# Patient Record
Sex: Female | Born: 1935 | Race: White | Hispanic: No | Marital: Married | Smoking: Never smoker
Health system: Southern US, Community
[De-identification: ages and names within clinical notes are randomized; demographics above are authoritative.]

## PROBLEM LIST (undated history)

## (undated) DIAGNOSIS — I10 Essential (primary) hypertension: Secondary | ICD-10-CM

## (undated) DIAGNOSIS — E119 Type 2 diabetes mellitus without complications: Secondary | ICD-10-CM

## (undated) DIAGNOSIS — F0390 Unspecified dementia without behavioral disturbance: Secondary | ICD-10-CM

## (undated) HISTORY — PX: ABDOMINAL HYSTERECTOMY: SHX81

## (undated) HISTORY — PX: BREAST EXCISIONAL BIOPSY: SUR124

## (undated) HISTORY — PX: EYE SURGERY: SHX253

---

## 2006-11-08 ENCOUNTER — Encounter: Admission: RE | Admit: 2006-11-08 | Discharge: 2006-11-08 | Payer: Self-pay | Admitting: Internal Medicine

## 2007-05-13 ENCOUNTER — Encounter: Admission: RE | Admit: 2007-05-13 | Discharge: 2007-05-13 | Payer: Self-pay | Admitting: Internal Medicine

## 2007-11-25 ENCOUNTER — Encounter: Admission: RE | Admit: 2007-11-25 | Discharge: 2007-11-25 | Payer: Self-pay | Admitting: Internal Medicine

## 2008-11-30 ENCOUNTER — Encounter: Admission: RE | Admit: 2008-11-30 | Discharge: 2008-11-30 | Payer: Self-pay | Admitting: Internal Medicine

## 2009-12-06 ENCOUNTER — Encounter: Admission: RE | Admit: 2009-12-06 | Discharge: 2009-12-06 | Payer: Self-pay | Admitting: Internal Medicine

## 2010-11-29 ENCOUNTER — Other Ambulatory Visit: Payer: Self-pay | Admitting: Internal Medicine

## 2010-11-29 DIAGNOSIS — Z1239 Encounter for other screening for malignant neoplasm of breast: Secondary | ICD-10-CM

## 2010-12-12 ENCOUNTER — Ambulatory Visit
Admission: RE | Admit: 2010-12-12 | Discharge: 2010-12-12 | Disposition: A | Discharge status: Home / Self Care | Payer: Medicare Other | Source: Ambulatory Visit | Attending: Internal Medicine | Admitting: Internal Medicine

## 2010-12-12 DIAGNOSIS — Z1239 Encounter for other screening for malignant neoplasm of breast: Secondary | ICD-10-CM

## 2011-12-19 ENCOUNTER — Other Ambulatory Visit: Payer: Self-pay | Admitting: Internal Medicine

## 2011-12-19 DIAGNOSIS — Z1231 Encounter for screening mammogram for malignant neoplasm of breast: Secondary | ICD-10-CM

## 2012-01-08 ENCOUNTER — Ambulatory Visit: Payer: Self-pay

## 2012-06-03 ENCOUNTER — Ambulatory Visit
Admission: RE | Admit: 2012-06-03 | Discharge: 2012-06-03 | Disposition: A | Discharge status: Home / Self Care | Payer: Medicare Other | Source: Ambulatory Visit | Attending: Internal Medicine | Admitting: Internal Medicine

## 2012-06-03 DIAGNOSIS — Z1231 Encounter for screening mammogram for malignant neoplasm of breast: Secondary | ICD-10-CM

## 2015-11-01 ENCOUNTER — Other Ambulatory Visit: Payer: Self-pay

## 2015-11-01 ENCOUNTER — Ambulatory Visit
Admission: RE | Admit: 2015-11-01 | Discharge: 2015-11-01 | Disposition: A | Discharge status: Home / Self Care | Payer: Medicare Other | Source: Ambulatory Visit

## 2015-11-01 DIAGNOSIS — Z1231 Encounter for screening mammogram for malignant neoplasm of breast: Secondary | ICD-10-CM | POA: Diagnosis not present

## 2015-11-08 DIAGNOSIS — Z6832 Body mass index (BMI) 32.0-32.9, adult: Secondary | ICD-10-CM | POA: Diagnosis not present

## 2015-11-08 DIAGNOSIS — Z713 Dietary counseling and surveillance: Secondary | ICD-10-CM | POA: Diagnosis not present

## 2015-11-08 DIAGNOSIS — E1165 Type 2 diabetes mellitus with hyperglycemia: Secondary | ICD-10-CM | POA: Diagnosis not present

## 2015-11-08 DIAGNOSIS — I10 Essential (primary) hypertension: Secondary | ICD-10-CM | POA: Diagnosis not present

## 2016-02-07 DIAGNOSIS — I10 Essential (primary) hypertension: Secondary | ICD-10-CM | POA: Diagnosis not present

## 2016-02-07 DIAGNOSIS — E1165 Type 2 diabetes mellitus with hyperglycemia: Secondary | ICD-10-CM | POA: Diagnosis not present

## 2016-03-27 DIAGNOSIS — E119 Type 2 diabetes mellitus without complications: Secondary | ICD-10-CM | POA: Diagnosis not present

## 2016-03-27 DIAGNOSIS — E7800 Pure hypercholesterolemia, unspecified: Secondary | ICD-10-CM | POA: Diagnosis not present

## 2016-03-27 DIAGNOSIS — I10 Essential (primary) hypertension: Secondary | ICD-10-CM | POA: Diagnosis not present

## 2016-05-11 DIAGNOSIS — I10 Essential (primary) hypertension: Secondary | ICD-10-CM | POA: Diagnosis not present

## 2016-05-11 DIAGNOSIS — E7800 Pure hypercholesterolemia, unspecified: Secondary | ICD-10-CM | POA: Diagnosis not present

## 2016-05-11 DIAGNOSIS — E119 Type 2 diabetes mellitus without complications: Secondary | ICD-10-CM | POA: Diagnosis not present

## 2016-05-16 DIAGNOSIS — M25561 Pain in right knee: Secondary | ICD-10-CM | POA: Diagnosis not present

## 2016-05-16 DIAGNOSIS — Z299 Encounter for prophylactic measures, unspecified: Secondary | ICD-10-CM | POA: Diagnosis not present

## 2016-06-12 DIAGNOSIS — E119 Type 2 diabetes mellitus without complications: Secondary | ICD-10-CM | POA: Diagnosis not present

## 2016-06-12 DIAGNOSIS — I10 Essential (primary) hypertension: Secondary | ICD-10-CM | POA: Diagnosis not present

## 2016-06-12 DIAGNOSIS — E7800 Pure hypercholesterolemia, unspecified: Secondary | ICD-10-CM | POA: Diagnosis not present

## 2016-07-26 DIAGNOSIS — E7800 Pure hypercholesterolemia, unspecified: Secondary | ICD-10-CM | POA: Diagnosis not present

## 2016-07-26 DIAGNOSIS — I10 Essential (primary) hypertension: Secondary | ICD-10-CM | POA: Diagnosis not present

## 2016-07-26 DIAGNOSIS — E119 Type 2 diabetes mellitus without complications: Secondary | ICD-10-CM | POA: Diagnosis not present

## 2016-07-31 DIAGNOSIS — Z23 Encounter for immunization: Secondary | ICD-10-CM | POA: Diagnosis not present

## 2016-08-07 DIAGNOSIS — Z0000 Encounter for general adult medical examination without abnormal findings: Secondary | ICD-10-CM | POA: Diagnosis not present

## 2016-08-07 DIAGNOSIS — Z1211 Encounter for screening for malignant neoplasm of colon: Secondary | ICD-10-CM | POA: Diagnosis not present

## 2016-08-07 DIAGNOSIS — Z6837 Body mass index (BMI) 37.0-37.9, adult: Secondary | ICD-10-CM | POA: Diagnosis not present

## 2016-08-07 DIAGNOSIS — E1165 Type 2 diabetes mellitus with hyperglycemia: Secondary | ICD-10-CM | POA: Diagnosis not present

## 2016-08-07 DIAGNOSIS — Z299 Encounter for prophylactic measures, unspecified: Secondary | ICD-10-CM | POA: Diagnosis not present

## 2016-08-07 DIAGNOSIS — E7800 Pure hypercholesterolemia, unspecified: Secondary | ICD-10-CM | POA: Diagnosis not present

## 2016-08-07 DIAGNOSIS — Z90710 Acquired absence of both cervix and uterus: Secondary | ICD-10-CM | POA: Diagnosis not present

## 2016-08-07 DIAGNOSIS — Z79899 Other long term (current) drug therapy: Secondary | ICD-10-CM | POA: Diagnosis not present

## 2016-08-07 DIAGNOSIS — Z7189 Other specified counseling: Secondary | ICD-10-CM | POA: Diagnosis not present

## 2016-08-07 DIAGNOSIS — Z1389 Encounter for screening for other disorder: Secondary | ICD-10-CM | POA: Diagnosis not present

## 2016-08-07 DIAGNOSIS — R5383 Other fatigue: Secondary | ICD-10-CM | POA: Diagnosis not present

## 2016-10-02 DIAGNOSIS — E8940 Asymptomatic postprocedural ovarian failure: Secondary | ICD-10-CM | POA: Diagnosis not present

## 2016-11-13 DIAGNOSIS — Z6837 Body mass index (BMI) 37.0-37.9, adult: Secondary | ICD-10-CM | POA: Diagnosis not present

## 2016-11-13 DIAGNOSIS — Z299 Encounter for prophylactic measures, unspecified: Secondary | ICD-10-CM | POA: Diagnosis not present

## 2016-11-13 DIAGNOSIS — Z713 Dietary counseling and surveillance: Secondary | ICD-10-CM | POA: Diagnosis not present

## 2016-11-13 DIAGNOSIS — E1165 Type 2 diabetes mellitus with hyperglycemia: Secondary | ICD-10-CM | POA: Diagnosis not present

## 2016-12-04 ENCOUNTER — Ambulatory Visit
Admission: RE | Admit: 2016-12-04 | Discharge: 2016-12-04 | Disposition: A | Discharge status: Home / Self Care | Payer: Medicare Other | Source: Ambulatory Visit | Attending: Internal Medicine | Admitting: Internal Medicine

## 2016-12-04 ENCOUNTER — Other Ambulatory Visit: Payer: Self-pay | Admitting: Internal Medicine

## 2016-12-04 DIAGNOSIS — Z1231 Encounter for screening mammogram for malignant neoplasm of breast: Secondary | ICD-10-CM

## 2017-02-19 DIAGNOSIS — I10 Essential (primary) hypertension: Secondary | ICD-10-CM | POA: Diagnosis not present

## 2017-02-19 DIAGNOSIS — E1165 Type 2 diabetes mellitus with hyperglycemia: Secondary | ICD-10-CM | POA: Diagnosis not present

## 2017-02-19 DIAGNOSIS — E7800 Pure hypercholesterolemia, unspecified: Secondary | ICD-10-CM | POA: Diagnosis not present

## 2017-02-19 DIAGNOSIS — Z299 Encounter for prophylactic measures, unspecified: Secondary | ICD-10-CM | POA: Diagnosis not present

## 2017-02-19 DIAGNOSIS — R42 Dizziness and giddiness: Secondary | ICD-10-CM | POA: Diagnosis not present

## 2017-02-19 DIAGNOSIS — Z6835 Body mass index (BMI) 35.0-35.9, adult: Secondary | ICD-10-CM | POA: Diagnosis not present

## 2017-06-11 DIAGNOSIS — E7800 Pure hypercholesterolemia, unspecified: Secondary | ICD-10-CM | POA: Diagnosis not present

## 2017-06-11 DIAGNOSIS — R413 Other amnesia: Secondary | ICD-10-CM | POA: Diagnosis not present

## 2017-06-11 DIAGNOSIS — E1165 Type 2 diabetes mellitus with hyperglycemia: Secondary | ICD-10-CM | POA: Diagnosis not present

## 2017-06-11 DIAGNOSIS — Z299 Encounter for prophylactic measures, unspecified: Secondary | ICD-10-CM | POA: Diagnosis not present

## 2017-06-11 DIAGNOSIS — I10 Essential (primary) hypertension: Secondary | ICD-10-CM | POA: Diagnosis not present

## 2017-07-02 DIAGNOSIS — I10 Essential (primary) hypertension: Secondary | ICD-10-CM | POA: Diagnosis not present

## 2017-07-02 DIAGNOSIS — Z299 Encounter for prophylactic measures, unspecified: Secondary | ICD-10-CM | POA: Diagnosis not present

## 2017-07-02 DIAGNOSIS — M810 Age-related osteoporosis without current pathological fracture: Secondary | ICD-10-CM | POA: Diagnosis not present

## 2017-07-02 DIAGNOSIS — Z6835 Body mass index (BMI) 35.0-35.9, adult: Secondary | ICD-10-CM | POA: Diagnosis not present

## 2017-07-02 DIAGNOSIS — E1165 Type 2 diabetes mellitus with hyperglycemia: Secondary | ICD-10-CM | POA: Diagnosis not present

## 2017-07-02 DIAGNOSIS — E7800 Pure hypercholesterolemia, unspecified: Secondary | ICD-10-CM | POA: Diagnosis not present

## 2017-07-02 DIAGNOSIS — F0390 Unspecified dementia without behavioral disturbance: Secondary | ICD-10-CM | POA: Diagnosis not present

## 2017-07-10 DIAGNOSIS — Z8673 Personal history of transient ischemic attack (TIA), and cerebral infarction without residual deficits: Secondary | ICD-10-CM | POA: Diagnosis not present

## 2017-07-10 DIAGNOSIS — F068 Other specified mental disorders due to known physiological condition: Secondary | ICD-10-CM | POA: Diagnosis not present

## 2017-07-10 DIAGNOSIS — R413 Other amnesia: Secondary | ICD-10-CM | POA: Diagnosis not present

## 2017-07-10 DIAGNOSIS — I6782 Cerebral ischemia: Secondary | ICD-10-CM | POA: Diagnosis not present

## 2017-07-10 DIAGNOSIS — G319 Degenerative disease of nervous system, unspecified: Secondary | ICD-10-CM | POA: Diagnosis not present

## 2017-07-23 DIAGNOSIS — Z6835 Body mass index (BMI) 35.0-35.9, adult: Secondary | ICD-10-CM | POA: Diagnosis not present

## 2017-07-23 DIAGNOSIS — Z299 Encounter for prophylactic measures, unspecified: Secondary | ICD-10-CM | POA: Diagnosis not present

## 2017-07-23 DIAGNOSIS — F0390 Unspecified dementia without behavioral disturbance: Secondary | ICD-10-CM | POA: Diagnosis not present

## 2017-07-23 DIAGNOSIS — E7800 Pure hypercholesterolemia, unspecified: Secondary | ICD-10-CM | POA: Diagnosis not present

## 2017-07-23 DIAGNOSIS — E1165 Type 2 diabetes mellitus with hyperglycemia: Secondary | ICD-10-CM | POA: Diagnosis not present

## 2017-07-23 DIAGNOSIS — I10 Essential (primary) hypertension: Secondary | ICD-10-CM | POA: Diagnosis not present

## 2017-08-09 DIAGNOSIS — Z23 Encounter for immunization: Secondary | ICD-10-CM | POA: Diagnosis not present

## 2017-08-26 DIAGNOSIS — Z79899 Other long term (current) drug therapy: Secondary | ICD-10-CM | POA: Diagnosis not present

## 2017-08-26 DIAGNOSIS — I10 Essential (primary) hypertension: Secondary | ICD-10-CM | POA: Diagnosis not present

## 2017-08-26 DIAGNOSIS — E7800 Pure hypercholesterolemia, unspecified: Secondary | ICD-10-CM | POA: Diagnosis not present

## 2017-08-26 DIAGNOSIS — Z7189 Other specified counseling: Secondary | ICD-10-CM | POA: Diagnosis not present

## 2017-08-26 DIAGNOSIS — E669 Obesity, unspecified: Secondary | ICD-10-CM | POA: Diagnosis not present

## 2017-08-26 DIAGNOSIS — E1165 Type 2 diabetes mellitus with hyperglycemia: Secondary | ICD-10-CM | POA: Diagnosis not present

## 2017-08-26 DIAGNOSIS — Z0000 Encounter for general adult medical examination without abnormal findings: Secondary | ICD-10-CM | POA: Diagnosis not present

## 2017-08-26 DIAGNOSIS — R5383 Other fatigue: Secondary | ICD-10-CM | POA: Diagnosis not present

## 2017-08-26 DIAGNOSIS — Z1339 Encounter for screening examination for other mental health and behavioral disorders: Secondary | ICD-10-CM | POA: Diagnosis not present

## 2017-08-26 DIAGNOSIS — Z1211 Encounter for screening for malignant neoplasm of colon: Secondary | ICD-10-CM | POA: Diagnosis not present

## 2017-08-26 DIAGNOSIS — Z299 Encounter for prophylactic measures, unspecified: Secondary | ICD-10-CM | POA: Diagnosis not present

## 2017-08-26 DIAGNOSIS — Z1331 Encounter for screening for depression: Secondary | ICD-10-CM | POA: Diagnosis not present

## 2017-09-17 DIAGNOSIS — Z789 Other specified health status: Secondary | ICD-10-CM | POA: Diagnosis not present

## 2017-09-17 DIAGNOSIS — Z299 Encounter for prophylactic measures, unspecified: Secondary | ICD-10-CM | POA: Diagnosis not present

## 2017-09-17 DIAGNOSIS — E1165 Type 2 diabetes mellitus with hyperglycemia: Secondary | ICD-10-CM | POA: Diagnosis not present

## 2017-09-17 DIAGNOSIS — Z713 Dietary counseling and surveillance: Secondary | ICD-10-CM | POA: Diagnosis not present

## 2017-09-17 DIAGNOSIS — Z6834 Body mass index (BMI) 34.0-34.9, adult: Secondary | ICD-10-CM | POA: Diagnosis not present

## 2017-10-18 DIAGNOSIS — N390 Urinary tract infection, site not specified: Secondary | ICD-10-CM | POA: Diagnosis not present

## 2017-10-18 DIAGNOSIS — R404 Transient alteration of awareness: Secondary | ICD-10-CM | POA: Diagnosis not present

## 2017-10-18 DIAGNOSIS — N289 Disorder of kidney and ureter, unspecified: Secondary | ICD-10-CM | POA: Diagnosis not present

## 2017-10-18 DIAGNOSIS — M810 Age-related osteoporosis without current pathological fracture: Secondary | ICD-10-CM | POA: Diagnosis not present

## 2017-10-18 DIAGNOSIS — I10 Essential (primary) hypertension: Secondary | ICD-10-CM | POA: Diagnosis not present

## 2017-10-18 DIAGNOSIS — E876 Hypokalemia: Secondary | ICD-10-CM | POA: Diagnosis not present

## 2017-10-18 DIAGNOSIS — R42 Dizziness and giddiness: Secondary | ICD-10-CM | POA: Diagnosis not present

## 2017-10-18 DIAGNOSIS — R55 Syncope and collapse: Secondary | ICD-10-CM | POA: Diagnosis not present

## 2017-10-18 DIAGNOSIS — Z79899 Other long term (current) drug therapy: Secondary | ICD-10-CM | POA: Diagnosis not present

## 2017-10-18 DIAGNOSIS — Z7984 Long term (current) use of oral hypoglycemic drugs: Secondary | ICD-10-CM | POA: Diagnosis not present

## 2017-10-31 DIAGNOSIS — F0390 Unspecified dementia without behavioral disturbance: Secondary | ICD-10-CM | POA: Diagnosis not present

## 2017-10-31 DIAGNOSIS — I10 Essential (primary) hypertension: Secondary | ICD-10-CM | POA: Diagnosis not present

## 2017-10-31 DIAGNOSIS — N390 Urinary tract infection, site not specified: Secondary | ICD-10-CM | POA: Diagnosis not present

## 2017-10-31 DIAGNOSIS — E7800 Pure hypercholesterolemia, unspecified: Secondary | ICD-10-CM | POA: Diagnosis not present

## 2017-10-31 DIAGNOSIS — Z6833 Body mass index (BMI) 33.0-33.9, adult: Secondary | ICD-10-CM | POA: Diagnosis not present

## 2017-11-08 DIAGNOSIS — E876 Hypokalemia: Secondary | ICD-10-CM | POA: Diagnosis not present

## 2017-12-26 DIAGNOSIS — E7800 Pure hypercholesterolemia, unspecified: Secondary | ICD-10-CM | POA: Diagnosis not present

## 2017-12-26 DIAGNOSIS — I6381 Other cerebral infarction due to occlusion or stenosis of small artery: Secondary | ICD-10-CM | POA: Diagnosis not present

## 2017-12-26 DIAGNOSIS — Z6832 Body mass index (BMI) 32.0-32.9, adult: Secondary | ICD-10-CM | POA: Diagnosis not present

## 2017-12-26 DIAGNOSIS — F0390 Unspecified dementia without behavioral disturbance: Secondary | ICD-10-CM | POA: Diagnosis not present

## 2017-12-26 DIAGNOSIS — Z299 Encounter for prophylactic measures, unspecified: Secondary | ICD-10-CM | POA: Diagnosis not present

## 2017-12-26 DIAGNOSIS — I10 Essential (primary) hypertension: Secondary | ICD-10-CM | POA: Diagnosis not present

## 2017-12-26 DIAGNOSIS — E1165 Type 2 diabetes mellitus with hyperglycemia: Secondary | ICD-10-CM | POA: Diagnosis not present

## 2018-01-09 DIAGNOSIS — E7800 Pure hypercholesterolemia, unspecified: Secondary | ICD-10-CM | POA: Diagnosis not present

## 2018-01-09 DIAGNOSIS — I6381 Other cerebral infarction due to occlusion or stenosis of small artery: Secondary | ICD-10-CM | POA: Diagnosis not present

## 2018-01-09 DIAGNOSIS — J329 Chronic sinusitis, unspecified: Secondary | ICD-10-CM | POA: Diagnosis not present

## 2018-01-09 DIAGNOSIS — Z299 Encounter for prophylactic measures, unspecified: Secondary | ICD-10-CM | POA: Diagnosis not present

## 2018-01-09 DIAGNOSIS — E1165 Type 2 diabetes mellitus with hyperglycemia: Secondary | ICD-10-CM | POA: Diagnosis not present

## 2018-01-09 DIAGNOSIS — Z6832 Body mass index (BMI) 32.0-32.9, adult: Secondary | ICD-10-CM | POA: Diagnosis not present

## 2018-01-09 DIAGNOSIS — Z789 Other specified health status: Secondary | ICD-10-CM | POA: Diagnosis not present

## 2018-01-28 DIAGNOSIS — Z6831 Body mass index (BMI) 31.0-31.9, adult: Secondary | ICD-10-CM | POA: Diagnosis not present

## 2018-01-28 DIAGNOSIS — R05 Cough: Secondary | ICD-10-CM | POA: Diagnosis not present

## 2018-01-28 DIAGNOSIS — Z299 Encounter for prophylactic measures, unspecified: Secondary | ICD-10-CM | POA: Diagnosis not present

## 2018-01-28 DIAGNOSIS — J189 Pneumonia, unspecified organism: Secondary | ICD-10-CM | POA: Diagnosis not present

## 2018-01-28 DIAGNOSIS — I10 Essential (primary) hypertension: Secondary | ICD-10-CM | POA: Diagnosis not present

## 2018-01-28 DIAGNOSIS — Z713 Dietary counseling and surveillance: Secondary | ICD-10-CM | POA: Diagnosis not present

## 2018-01-28 DIAGNOSIS — J22 Unspecified acute lower respiratory infection: Secondary | ICD-10-CM | POA: Diagnosis not present

## 2018-02-06 ENCOUNTER — Other Ambulatory Visit: Payer: Self-pay

## 2018-02-06 ENCOUNTER — Emergency Department (HOSPITAL_COMMUNITY): Payer: Medicare Other

## 2018-02-06 ENCOUNTER — Encounter (HOSPITAL_COMMUNITY): Payer: Self-pay | Admitting: Emergency Medicine

## 2018-02-06 ENCOUNTER — Inpatient Hospital Stay (HOSPITAL_COMMUNITY)
Admission: EM | Admit: 2018-02-06 | Discharge: 2018-02-08 | DRG: 872 | Disposition: A | Discharge status: Home / Self Care | Payer: Medicare Other | Attending: Internal Medicine | Admitting: Internal Medicine

## 2018-02-06 DIAGNOSIS — R531 Weakness: Secondary | ICD-10-CM

## 2018-02-06 DIAGNOSIS — Z7984 Long term (current) use of oral hypoglycemic drugs: Secondary | ICD-10-CM | POA: Diagnosis not present

## 2018-02-06 DIAGNOSIS — Z9181 History of falling: Secondary | ICD-10-CM

## 2018-02-06 DIAGNOSIS — I10 Essential (primary) hypertension: Secondary | ICD-10-CM | POA: Diagnosis present

## 2018-02-06 DIAGNOSIS — F0390 Unspecified dementia without behavioral disturbance: Secondary | ICD-10-CM | POA: Diagnosis present

## 2018-02-06 DIAGNOSIS — Z79899 Other long term (current) drug therapy: Secondary | ICD-10-CM

## 2018-02-06 DIAGNOSIS — Z299 Encounter for prophylactic measures, unspecified: Secondary | ICD-10-CM | POA: Diagnosis not present

## 2018-02-06 DIAGNOSIS — I6381 Other cerebral infarction due to occlusion or stenosis of small artery: Secondary | ICD-10-CM | POA: Diagnosis not present

## 2018-02-06 DIAGNOSIS — R55 Syncope and collapse: Secondary | ICD-10-CM | POA: Diagnosis not present

## 2018-02-06 DIAGNOSIS — I6529 Occlusion and stenosis of unspecified carotid artery: Secondary | ICD-10-CM | POA: Diagnosis not present

## 2018-02-06 DIAGNOSIS — E11649 Type 2 diabetes mellitus with hypoglycemia without coma: Secondary | ICD-10-CM | POA: Diagnosis present

## 2018-02-06 DIAGNOSIS — E876 Hypokalemia: Secondary | ICD-10-CM | POA: Diagnosis present

## 2018-02-06 DIAGNOSIS — Z6829 Body mass index (BMI) 29.0-29.9, adult: Secondary | ICD-10-CM | POA: Diagnosis not present

## 2018-02-06 DIAGNOSIS — E162 Hypoglycemia, unspecified: Secondary | ICD-10-CM

## 2018-02-06 DIAGNOSIS — E872 Acidosis, unspecified: Secondary | ICD-10-CM | POA: Diagnosis present

## 2018-02-06 DIAGNOSIS — E860 Dehydration: Secondary | ICD-10-CM | POA: Diagnosis not present

## 2018-02-06 DIAGNOSIS — R410 Disorientation, unspecified: Secondary | ICD-10-CM | POA: Diagnosis not present

## 2018-02-06 DIAGNOSIS — A419 Sepsis, unspecified organism: Principal | ICD-10-CM | POA: Diagnosis present

## 2018-02-06 DIAGNOSIS — E119 Type 2 diabetes mellitus without complications: Secondary | ICD-10-CM

## 2018-02-06 DIAGNOSIS — N390 Urinary tract infection, site not specified: Secondary | ICD-10-CM | POA: Diagnosis not present

## 2018-02-06 HISTORY — DX: Type 2 diabetes mellitus without complications: E11.9

## 2018-02-06 HISTORY — DX: Unspecified dementia, unspecified severity, without behavioral disturbance, psychotic disturbance, mood disturbance, and anxiety: F03.90

## 2018-02-06 HISTORY — DX: Essential (primary) hypertension: I10

## 2018-02-06 LAB — CBC WITH DIFFERENTIAL/PLATELET
BASOS PCT: 0 %
Basophils Absolute: 0 10*3/uL (ref 0.0–0.1)
Eosinophils Absolute: 0.1 10*3/uL (ref 0.0–0.7)
Eosinophils Relative: 1 %
HEMATOCRIT: 41.6 % (ref 36.0–46.0)
HEMOGLOBIN: 13.2 g/dL (ref 12.0–15.0)
LYMPHS ABS: 3.2 10*3/uL (ref 0.7–4.0)
LYMPHS PCT: 23 %
MCH: 30.8 pg (ref 26.0–34.0)
MCHC: 31.7 g/dL (ref 30.0–36.0)
MCV: 97 fL (ref 78.0–100.0)
MONOS PCT: 8 %
Monocytes Absolute: 1.2 10*3/uL — ABNORMAL HIGH (ref 0.1–1.0)
NEUTROS ABS: 9.4 10*3/uL — AB (ref 1.7–7.7)
Neutrophils Relative %: 68 %
Platelets: 269 10*3/uL (ref 150–400)
RBC: 4.29 MIL/uL (ref 3.87–5.11)
RDW: 14.3 % (ref 11.5–15.5)
WBC: 13.9 10*3/uL — ABNORMAL HIGH (ref 4.0–10.5)

## 2018-02-06 LAB — URINALYSIS, ROUTINE W REFLEX MICROSCOPIC
BILIRUBIN URINE: NEGATIVE
Glucose, UA: 50 mg/dL — AB
KETONES UR: 5 mg/dL — AB
Nitrite: NEGATIVE
PROTEIN: NEGATIVE mg/dL
Specific Gravity, Urine: 1.017 (ref 1.005–1.030)
pH: 5 (ref 5.0–8.0)

## 2018-02-06 LAB — HEPATIC FUNCTION PANEL
ALK PHOS: 60 U/L (ref 38–126)
ALT: 17 U/L (ref 14–54)
AST: 32 U/L (ref 15–41)
Albumin: 3.3 g/dL — ABNORMAL LOW (ref 3.5–5.0)
BILIRUBIN INDIRECT: 0.7 mg/dL (ref 0.3–0.9)
Bilirubin, Direct: 0.2 mg/dL (ref 0.1–0.5)
TOTAL PROTEIN: 6.5 g/dL (ref 6.5–8.1)
Total Bilirubin: 0.9 mg/dL (ref 0.3–1.2)

## 2018-02-06 LAB — BASIC METABOLIC PANEL
Anion gap: <20 — ABNORMAL HIGH (ref 5–15)
BUN: 31 mg/dL — ABNORMAL HIGH (ref 6–20)
CHLORIDE: 91 mmol/L — AB (ref 101–111)
CO2: 25 mmol/L (ref 22–32)
CREATININE: 1.49 mg/dL — AB (ref 0.44–1.00)
Calcium: 9.4 mg/dL (ref 8.9–10.3)
GFR calc non Af Amer: 32 mL/min — ABNORMAL LOW (ref >60)
GFR, EST AFRICAN AMERICAN: 37 mL/min — AB (ref >60)
Glucose, Bld: 55 mg/dL — ABNORMAL LOW (ref 65–99)
Potassium: 3.1 mmol/L — ABNORMAL LOW (ref 3.5–5.1)
Sodium: 137 mmol/L (ref 135–145)

## 2018-02-06 LAB — I-STAT CG4 LACTIC ACID, ED
LACTIC ACID, VENOUS: 7.83 mmol/L — AB (ref 0.5–1.9)
Lactic Acid, Venous: <0.3 mmol/L — ABNORMAL LOW (ref 0.5–1.9)

## 2018-02-06 LAB — TROPONIN I: Troponin I: <0.03 ng/mL (ref <0.03)

## 2018-02-06 LAB — MAGNESIUM: Magnesium: 1.9 mg/dL (ref 1.7–2.4)

## 2018-02-06 LAB — LIPASE, BLOOD: Lipase: 75 U/L — ABNORMAL HIGH (ref 11–51)

## 2018-02-06 MED ORDER — MEMANTINE HCL ER 7 MG PO CP24
21.0000 mg | ORAL_CAPSULE | Freq: Every evening | ORAL | Status: DC
  Administered 2018-02-06 – 2018-02-07 (×2): 21 mg via ORAL
  Filled 2018-02-06 (×4): qty 3

## 2018-02-06 MED ORDER — VANCOMYCIN HCL IN DEXTROSE 1-5 GM/200ML-% IV SOLN
1000.0000 mg | Freq: Once | INTRAVENOUS | Status: DC
  Filled 2018-02-06: qty 200

## 2018-02-06 MED ORDER — SODIUM CHLORIDE 0.9 % IV SOLN
2.0000 g | INTRAVENOUS | Status: DC
  Administered 2018-02-06 – 2018-02-07 (×2): 2 g via INTRAVENOUS
  Filled 2018-02-06: qty 20
  Filled 2018-02-06: qty 2
  Filled 2018-02-06 (×3): qty 20

## 2018-02-06 MED ORDER — VANCOMYCIN HCL 10 G IV SOLR
1500.0000 mg | Freq: Once | INTRAVENOUS | Status: AC
  Administered 2018-02-06: 1500 mg via INTRAVENOUS
  Filled 2018-02-06: qty 1500

## 2018-02-06 MED ORDER — DEXTROSE-NACL 5-0.45 % IV SOLN
INTRAVENOUS | Status: DC
Start: 2018-02-06 — End: 2018-02-07
  Administered 2018-02-06: 18:00:00 via INTRAVENOUS

## 2018-02-06 MED ORDER — LORATADINE 10 MG PO TABS
10.0000 mg | ORAL_TABLET | Freq: Every day | ORAL | Status: DC
  Administered 2018-02-06 – 2018-02-08 (×3): 10 mg via ORAL
  Filled 2018-02-06 (×3): qty 1

## 2018-02-06 MED ORDER — SODIUM CHLORIDE 0.9 % IV BOLUS
1000.0000 mL | Freq: Once | INTRAVENOUS | Status: AC
  Administered 2018-02-06: 1000 mL via INTRAVENOUS

## 2018-02-06 MED ORDER — ONDANSETRON HCL 4 MG PO TABS: 4.0000 mg | ORAL_TABLET | Freq: Four times a day (QID) | ORAL | Status: DC | PRN

## 2018-02-06 MED ORDER — ACETAMINOPHEN 325 MG PO TABS: 650.0000 mg | ORAL_TABLET | Freq: Four times a day (QID) | ORAL | Status: DC | PRN

## 2018-02-06 MED ORDER — DEXTROSE 50 % IV SOLN
INTRAVENOUS | Status: AC
  Administered 2018-02-06: 20:00:00
  Filled 2018-02-06: qty 50

## 2018-02-06 MED ORDER — SODIUM CHLORIDE 0.9 % IV BOLUS
500.0000 mL | Freq: Once | INTRAVENOUS | Status: AC
Start: 2018-02-06 — End: 2018-02-06
  Administered 2018-02-06: 500 mL via INTRAVENOUS

## 2018-02-06 MED ORDER — DONEPEZIL HCL 5 MG PO TABS
10.0000 mg | ORAL_TABLET | Freq: Every evening | ORAL | Status: DC
  Administered 2018-02-06 – 2018-02-07 (×2): 10 mg via ORAL
  Filled 2018-02-06 (×2): qty 2
  Filled 2018-02-06: qty 1

## 2018-02-06 MED ORDER — ACETAMINOPHEN 650 MG RE SUPP: 650.0000 mg | Freq: Four times a day (QID) | RECTAL | Status: DC | PRN

## 2018-02-06 MED ORDER — POTASSIUM CHLORIDE 10 MEQ/100ML IV SOLN
10.0000 meq | INTRAVENOUS | Status: AC
  Administered 2018-02-06 – 2018-02-07 (×4): 10 meq via INTRAVENOUS
  Filled 2018-02-06 (×5): qty 100

## 2018-02-06 MED ORDER — DEXTROSE 50 % IV SOLN
1.0000 | Freq: Once | INTRAVENOUS | Status: AC
  Administered 2018-02-06: 50 mL via INTRAVENOUS

## 2018-02-06 MED ORDER — PIPERACILLIN-TAZOBACTAM 3.375 G IVPB 30 MIN
3.3750 g | Freq: Once | INTRAVENOUS | Status: AC
  Administered 2018-02-06: 3.375 g via INTRAVENOUS
  Filled 2018-02-06: qty 50

## 2018-02-06 MED ORDER — MAGNESIUM GLUCONATE 500 MG PO TABS
500.0000 mg | ORAL_TABLET | Freq: Every evening | ORAL | Status: DC
  Administered 2018-02-07: 500 mg via ORAL
  Filled 2018-02-06 (×4): qty 1

## 2018-02-06 MED ORDER — ENOXAPARIN SODIUM 40 MG/0.4ML ~~LOC~~ SOLN
40.0000 mg | SUBCUTANEOUS | Status: DC
  Administered 2018-02-06 – 2018-02-07 (×2): 40 mg via SUBCUTANEOUS
  Filled 2018-02-06 (×2): qty 0.4

## 2018-02-06 MED ORDER — ONDANSETRON HCL 4 MG/2ML IJ SOLN: 4.0000 mg | Freq: Four times a day (QID) | INTRAMUSCULAR | Status: DC | PRN

## 2018-02-06 NOTE — ED Notes (Signed)
ekg handed off to Dr. Dayna Barker

## 2018-02-06 NOTE — ED Provider Notes (Signed)
Emergency Department Provider Note   I have reviewed the triage vital signs and the nursing notes.   HISTORY  Chief Complaint Weakness   HPI Victoria Christian is a 83 y.o. female with PMH of DM, HTN, and new-onset Dementia starting in Dec 2018 resents to the emergency department for evaluation of worsening generalized weakness, confusion, lightheadedness with standing, and hypertension at the PCP office today.  The patient's daughter provides most of the history due to the patient's underlying dementia.  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  She began to have sudden onset confusion and an MRI at that time confirmed a stroke.  She has subsequently developed rapid onset and progressively worsening dementia symptoms.  She has been taking her medications for dementia but is no longer able to work and over the past 2 weeks she has become much more weak and confused.  She did have an episode of strep throat in March which was treated.  Family states she is not eating or drinking and they have been following with the PCP who is apparently concerned about her potassium levels and kidney function based on labs drawn in March.  Family has not noticed any fevers.  The patient did have a fall approximately 2 weeks ago with unknown head trauma.  Denies any dysuria, hesitancy, urgency.  Weakness and lightheadedness is mostly with standing.  Denies any chest pain or palpitations.   Level 5 caveat: Dementia.   Past Medical History:  Diagnosis Date  . Dementia   . Diabetes mellitus without complication ([place])   . Hypertension     There are no active problems to display for this patient.   Past Surgical History:  Procedure Laterality Date  . ABDOMINAL HYSTERECTOMY    . BREAST EXCISIONAL BIOPSY Left    1975  . EYE SURGERY        Allergies Patient has no known allergies.  Family History  Problem Relation Age of Onset  . Congestive Heart Failure  Mother   . Dementia Father   . Cancer Other     Social History Social History   Tobacco Use  . Smoking status: Never Smoker  . Smokeless tobacco: Never Used  Substance Use Topics  . Alcohol use: Never    Frequency: Never  . Drug use: Never    Review of Systems  Constitutional: No fever/chills. Positive generalized weakness and lightheadedness with standing.  Eyes: No visual changes. ENT: No sore throat. Cardiovascular: Denies chest pain. Respiratory: Denies shortness of breath. Positive cough.  Gastrointestinal: No abdominal pain. Positive nausea, no vomiting. No diarrhea. No constipation. Genitourinary: Negative for dysuria. Musculoskeletal: Negative for back pain. Skin: Negative for rash. Neurological: Negative for headaches, focal weakness or numbness.  10-point ROS otherwise negative.  ____________________________________________   PHYSICAL EXAM:  VITAL SIGNS: ED Triage Vitals  Enc Vitals Group     BP 02/06/18 1436 (!) 95/51     Pulse Rate 02/06/18 1436 85     Resp 02/06/18 1436 18     Temp 02/06/18 1436 98.2 F (36.8 C)     Temp Source 02/06/18 1436 Oral     SpO2 02/06/18 1436 94 %     Weight 02/06/18 1436 159 lb (72.1 kg)     Height 02/06/18 1436 5' 1.5" (1.562 m)     Pain Score 02/06/18 1437 0   Constitutional: Alert with mild confusion. Well appearing and in no acute distress. Eyes: Conjunctivae are normal.  PERRL. Baseline dysconjugate gaze.  Head: Atraumatic. Nose: No congestion/rhinnorhea. Mouth/Throat: Mucous membranes are moist.  Oropharynx non-erythematous. Neck: No stridor.   Cardiovascular: Normal rate, regular rhythm. Good peripheral circulation. Grossly normal heart sounds.   Respiratory: Normal respiratory effort.  No retractions. Lungs CTAB. Gastrointestinal: Soft and nontender. No distention.  Musculoskeletal: No lower extremity tenderness nor edema. No gross deformities of extremities. Neurologic:  Normal speech and language. No gross  focal neurologic deficits are appreciated. Normal upper/lower extremity weakness. Baseline CN exam 2-12.  Skin:  Skin is warm, dry and intact. No rash noted.  ____________________________________________   LABS (all labs ordered are listed, but only abnormal results are displayed)  Labs Reviewed  CBC WITH DIFFERENTIAL/PLATELET - Abnormal; Notable for the following components:      Result Value   WBC 13.9 (*)    Neutro Abs 9.4 (*)    Monocytes Absolute 1.2 (*)    All other components within normal limits  BASIC METABOLIC PANEL - Abnormal; Notable for the following components:   Potassium 3.1 (*)    Chloride 91 (*)    Glucose, Bld 55 (*)    BUN 31 (*)    Creatinine, Ser 1.49 (*)    GFR calc non Af Amer 32 (*)    GFR calc Af Amer 37 (*)    Anion gap <20 (*)    All other components within normal limits  HEPATIC FUNCTION PANEL - Abnormal; Notable for the following components:   Albumin 3.3 (*)    All other components within normal limits  LIPASE, BLOOD - Abnormal; Notable for the following components:   Lipase 75 (*)    All other components within normal limits  I-STAT CG4 LACTIC ACID, ED - Abnormal; Notable for the following components:   Lactic Acid, Venous 7.83 (*)    All other components within normal limits  CULTURE, BLOOD (ROUTINE X 2)  CULTURE, BLOOD (ROUTINE X 2)  URINE CULTURE  TROPONIN I  URINALYSIS, ROUTINE W REFLEX MICROSCOPIC  CBG MONITORING, ED  I-STAT CG4 LACTIC ACID, ED  CBG MONITORING, ED   ____________________________________________  EKG   EKG Interpretation  Date/Time:  Thursday February 06 2018 14:41:01 EDT Ventricular Rate:  80 PR Interval:  152 QRS Duration: 78 QT Interval:  414 QTC Calculation: 477 R Axis:   -22 Text Interpretation:  Normal Sinus rhythm  No STEMI.  Confirmed by Nanda Quinton [phone]) on 02/06/2018 3:17:43 PM       ____________________________________________  RADIOLOGY  Dg Chest 2 View  Result Date: 02/06/2018 CLINICAL  DATA:  Confusion and weakness for 1 month EXAM: CHEST - 2 VIEW COMPARISON:  01/28/2018 FINDINGS: No acute airspace disease. No pleural effusion. Normal heart size with aortic atherosclerosis. No pneumothorax. Degenerative changes of the spine. IMPRESSION: No active cardiopulmonary disease. Electronically Signed   By: Donavan Foil M.D.   On: 02/06/2018 16:16   Ct Head Wo Contrast  Result Date: 02/06/2018 CLINICAL DATA:  Weakness for a month.  Hypotension.  Confusion. EXAM: CT HEAD WITHOUT CONTRAST TECHNIQUE: Contiguous axial images were obtained from the base of the skull through the vertex without intravenous contrast. COMPARISON:  October 18, 2017 FINDINGS: Brain: No subdural, epidural, or subarachnoid hemorrhage. Cerebellum, brainstem, and basal cisterns are normal. Ventricles and sulci are stable. Haning matter changes are stable. No acute cortical ischemia or infarct. No mass effect or midline shift. Prominent perivascular spaces versus lacunar infarcts in the left inferior basal ganglia. These are favored to represent prominent perivascular spaces. Vascular:  Calcified atherosclerosis in the intracranial carotids. Skull: Normal. Negative for fracture or focal lesion. Sinuses/Orbits: No acute finding. Other: None. IMPRESSION: No acute intracranial abnormalities to explain the patient's symptoms. Electronically Signed   By: Dorise Bullion III M.D   On: 02/06/2018 18:06    ____________________________________________   PROCEDURES  Procedure(s) performed:   .Critical Care Performed by: Margette Fast, MD Authorized by: Margette Fast, MD   Critical care provider statement:    Critical care time (minutes):  35   Critical care time was exclusive of:  Separately billable procedures and treating other patients and teaching time   Critical care was necessary to treat or prevent imminent or life-threatening deterioration of the following conditions:  Shock, dehydration and circulatory failure    Critical care was time spent personally by me on the following activities:  Blood draw for specimens, development of treatment plan with patient or surrogate, evaluation of patient's response to treatment, examination of patient, obtaining history from patient or surrogate, ordering and performing treatments and interventions, ordering and review of laboratory studies, ordering and review of radiographic studies, pulse oximetry, re-evaluation of patient's condition and review of old charts   I assumed direction of critical care for this patient from another provider in my specialty: no     ____________________________________________   INITIAL IMPRESSION / [place] / ED COURSE  Pertinent labs & imaging results that were available during my care of the patient were reviewed by me and considered in my medical decision making (see chart for details).  Patient presents to the emergency department for evaluation of worsening generalized weakness with confusion and new lead developing hypotension with near syncope at home.  Patient was found to be hypotensive in the PCPs office transferred to the emergency department.  Patient subsequently had a syncopal episode in triage that was not obviously provoked.  They were drawing blood earlier in the evaluation but that was over by the time the syncopal event occurred.  There is no obvious seizure activity.  Patient's blood pressure has normalized without specific intervention but her initial pressure was slightly hypotensive.  No signs of infection.  No focal neurological deficits.  Plan for repeat lab work along with CT of the head given the recent fall and chest x-ray with some mild cough.  UA ordered and pending.  EKG with no acute ischemic findings.  Doubt atypical ACS presentation.  06:26 PM Patient's lab work and imaging reviewed.  Patient does have a significantly elevated lactic acid which prompted blood cultures and antibiotics, although, I  have no evidence at this time other than elevated lactate and leukocytosis to suspect infection.  Her lactic acidosis may be secondary to a transient syncope/hypotension event versus seizure although her episode in triage was not described by staff to look like a seizure.  Her CT imaging of the head is normal.  Plan for IV fluids and dextrose given some mild hypoglycemia.  Patient remains awake and alert with some baseline confusion.   Discussed patient's case with Hospitalist to request admission. Patient and family (if present) updated with plan. Care transferred to Hospitalist service.  I reviewed all nursing notes, vitals, pertinent old records, EKGs, labs, imaging (as available).  ____________________________________________  FINAL CLINICAL IMPRESSION(S) / ED DIAGNOSES  Final diagnoses:  Syncope and collapse  Generalized weakness     MEDICATIONS GIVEN DURING THIS VISIT:  Medications  dextrose 5 %-0.45 % sodium chloride infusion ( Intravenous New Bag/Given 02/06/18 1813)  dextrose 50 %  solution (has no administration in time range)  vancomycin (VANCOCIN) 1,500 mg in sodium chloride 0.9 % 500 mL IVPB (1,500 mg Intravenous New Bag/Given 02/06/18 1700)  sodium chloride 0.9 % bolus 500 mL (0 mLs Intravenous Stopped 02/06/18 1718)  sodium chloride 0.9 % bolus 1,000 mL (0 mLs Intravenous Stopped 02/06/18 1815)  dextrose 50 % solution 50 mL (50 mLs Intravenous Given 02/06/18 1645)  piperacillin-tazobactam (ZOSYN) IVPB 3.375 g (0 g Intravenous Stopped 02/06/18 1718)    Note:  This document was prepared using Dragon voice recognition software and may include unintentional dictation errors.  Nanda Quinton, MD Emergency Medicine    Long, Wonda [facility], MD 02/06/18 [phone]

## 2018-02-06 NOTE — ED Triage Notes (Signed)
Patient weak for a month, seen by PCP 2 weeks ago and again today. Sent by PCP for eval. Hypotensive at office and reported confused.

## 2018-02-06 NOTE — ED Notes (Signed)
Date and time results received: 02/06/18 1625(use smartphrase ".now" to insert current time)  Test: lac acid  Critical Value: 7.83  Name of Provider Notified: long  Orders Received? Or Actions Taken?: ivf and abx

## 2018-02-06 NOTE — Progress Notes (Signed)
Pharmacy Note:  Initial antibiotic(s) regimen of Vancomycin and zosyn ordered by EDP to treat sepsis.  Estimated Creatinine Clearance: 27.7 mL/min (A) (by C-G formula based on SCr of 1.49 mg/dL (H)).   No Known Allergies  Vitals:   02/06/18 1513 02/06/18 1530  BP: (!) 145/76 120/61  Pulse: (!) 103 79  Resp:  16  Temp:    SpO2:  96%    Anti-infectives (From admission, onward)   Start     Dose/Rate Route Frequency Ordered Stop   02/06/18 1700  vancomycin (VANCOCIN) 1,500 mg in sodium chloride 0.9 % 500 mL IVPB     1,500 mg 250 mL/hr over 120 Minutes Intravenous  Once 02/06/18 1638     02/06/18 1630  piperacillin-tazobactam (ZOSYN) IVPB 3.375 g     3.375 g 100 mL/hr over 30 Minutes Intravenous  Once 02/06/18 1624     02/06/18 1630  vancomycin (VANCOCIN) IVPB 1000 mg/200 mL premix  Status:  Discontinued     1,000 mg 200 mL/hr over 60 Minutes Intravenous  Once 02/06/18 1624 02/06/18 1638      Plan: Initial dose(s) of vancomycin 1500mg  IV loading dose and zosyn 3.375gm IV  X 1 ordered. F/U admission orders for further dosing if therapy continued.  Isac Sarna, BS [place], [place] Clinical Pharmacist Pager [phone] 02/06/2018 4:38 PM

## 2018-02-06 NOTE — H&P (Signed)
History and Physical    Victoria Christian OVZ:[id number] DOB: 1935/08/12 DOA: 02/06/2018  PCP: Glenda Chroman, MD   Patient coming from:.  I have personally briefly reviewed patient's old medical records in [place]  Chief Complaint: Weakness and confusion.  HPI: Victoria Christian is a 83 y.o. female with medical history significant of dementia, type 2 diabetes, hypertension who is brought to the emergency department due to weakness, hypotension and confusion.  Her daughter states that she has been feeling weak for about a month or so.  She went to see her PCP about 2 weeks ago.  She was given Levaquin for a strep throat that she contracted from her granddaughter.  Today, her appetite was not good.  Around 1300, she became weak and confused. She subsequently was taken to see her PCP, but ended up being referred by her physician to the emergency department due to confusion and hypotension.  Her daughter states, that she tends to get confused when she gets an UTI or other types of infection.  After treatment in the ED, the patient's mental status has improved.  She is oriented x3 now.  She denies fever, chills, rhinorrhea, wheezing, hemoptysis, chest pain, palpitations, diaphoresis, PND or recent pitting edema of the lower extremities.  No abdominal pain, diarrhea, constipation, melena or hematochezia.  No dysuria, frequency or hematuria.  No heat or cold intolerance.  No polyuria, polydipsia or blurred vision.  ED Course: Initial vital signs temperature 98.33F, pulse 85, respirations 18, blood pressure 95/51 mmHg and O2 sat 94% on room air.  The patient received a 1500 mL normal saline bolus, vancomycin and Zosyn per pharmacy.  I added 40 mEq of potassium chloride IVPB.  While in the ER, she became unresponsive with a blank stare and had an episode of emesis.  Her CBG was 55 mg/dL.  She was started on D5 and 0.45% sodium chloride infusion.  Her workup showed a Lampert count of 13.9 with 68%  neutrophils, 23% lymphocytes and 8% monocytes, hemoglobin at 13.2 g/dL and platelets of 269.  Sodium is 137, potassium 3.1, chloride 91 and CO2 25 millimolar/L.  Her glucose was 55, BUN 31, creatinine 1.49, magnesium 1.9, calcium 9.4 mg/dL.  Her LFTs show a mildly decreased albumin at 3.3 g/dL, but all other values were normal.  First lactic acid level was 7.83 and second was less than 0.30 millimolar/L.  Review of Systems: As per HPI otherwise 10 point review of systems negative.    Past Medical History:  Diagnosis Date  . Dementia   . Diabetes mellitus without complication ([place])   . Hypertension     Past Surgical History:  Procedure Laterality Date  . ABDOMINAL HYSTERECTOMY    . BREAST EXCISIONAL BIOPSY Left    1975  . EYE SURGERY       reports that she has never smoked. She has never used smokeless tobacco. She reports that she does not drink alcohol or use drugs.  No Known Allergies  Family History  Problem Relation Age of Onset  . Congestive Heart Failure Mother   . Dementia Father   . Cancer Other     Prior to Admission medications   Medication Sig Start Date End Date Taking? Authorizing Provider  cetirizine (ZYRTEC) 10 MG tablet Take 10 mg by mouth every evening.   Yes [provider]  donepezil (ARICEPT) 10 MG tablet Take 10 mg by mouth every evening. 01/21/18  Yes [provider]  glimepiride (AMARYL) 2 MG  tablet Take 2 mg by mouth every morning. 12/20/17  Yes [provider]  lisinopril (PRINIVIL,ZESTRIL) 10 MG tablet Take 10 mg by mouth every morning. 12/20/17  Yes [provider]  magnesium gluconate (MAGONATE) 500 MG tablet Take 500 mg by mouth every evening.   Yes [provider]  Memantine HCl ER 21 MG CP24 Take 1 capsule by mouth every evening. 01/21/18  Yes [provider]  metFORMIN (GLUCOPHAGE) 500 MG tablet Take 500-1,000 mg by mouth See admin instructions. Two tablets in the morning and one tablet in the  evening   Yes [provider]  levofloxacin (LEVAQUIN) 500 MG tablet Take 500 mg by mouth daily. 01/28/18   [provider]    Physical Exam: Vitals:   02/06/18 1730 02/06/18 1800 02/06/18 1830 02/06/18 2000  BP: 135/66 (!) 146/63 (!) 145/76 134/61  Pulse: 73 73    Resp: (!) 25 20 17 18   Temp:      TempSrc:      SpO2: 99% 97%    Weight:      Height:        Constitutional: NAD, calm, comfortable Eyes: PERRL, lids and conjunctivae normal ENMT: Mucous membranes are moist, but lips look dry. Posterior pharynx clear of any exudate or lesions. Neck: normal, supple, no masses, no thyromegaly Respiratory: Decreased breath sounds on bases, but no wheezing, no crackles. Normal respiratory effort. No accessory muscle use.  Cardiovascular: Regular rate and rhythm, no murmurs / rubs / gallops. No extremity edema. 2+ pedal pulses. No carotid bruits.  Abdomen: Soft, no tenderness, no masses palpated. No hepatosplenomegaly. Bowel sounds positive.  Musculoskeletal: no clubbing / cyanosis. Good ROM, no contractures. Normal muscle tone.  Skin: Hyperpigmented lesions and patches on face and trunk. Neurologic: CN 2-12 grossly intact. Sensation intact, DTR normal. Strength 5/5 in all 4 gross neurological exam.  Psychiatric: Normal judgment and insight. Alert and oriented x 4. Normal mood.    Labs on Admission: I have personally reviewed following labs and imaging studies  CBC: Recent Labs  Lab 02/06/18 1503  WBC 13.9*  NEUTROABS 9.4*  HGB 13.2  HCT 41.6  MCV 97.0  PLT 809   Basic Metabolic Panel: Recent Labs  Lab 02/06/18 1503  NA 137  K 3.1*  CL 91*  CO2 25  GLUCOSE 55*  BUN 31*  CREATININE 1.49*  CALCIUM 9.4  MG 1.9   GFR: Estimated Creatinine Clearance: 27.7 mL/min (A) (by C-G formula based on SCr of 1.49 mg/dL (H)). Liver Function Tests: Recent Labs  Lab 02/06/18 1530  AST 32  ALT 17  ALKPHOS 60  BILITOT 0.9  PROT 6.5  ALBUMIN 3.3*   Recent Labs    Lab 02/06/18 1530  LIPASE 75*   No results for input(s): AMMONIA in the last 168 hours. Coagulation Profile: No results for input(s): INR, PROTIME in the last 168 hours. Cardiac Enzymes: Recent Labs  Lab 02/06/18 1503  TROPONINI <0.03   BNP (last 3 results) No results for input(s): PROBNP in the last 8760 hours. HbA1C: No results for input(s): HGBA1C in the last 72 hours. CBG: No results for input(s): GLUCAP in the last 168 hours. Lipid Profile: No results for input(s): CHOL, HDL, LDLCALC, TRIG, CHOLHDL, LDLDIRECT in the last 72 hours. Thyroid Function Tests: No results for input(s): TSH, T4TOTAL, FREET4, T3FREE, THYROIDAB in the last 72 hours. Anemia Panel: No results for input(s): VITAMINB12, FOLATE, FERRITIN, TIBC, IRON, RETICCTPCT in the last 72 hours. Urine analysis:  Component Value Date/Time   COLORURINE YELLOW 02/06/2018 1800   APPEARANCEUR HAZY (A) 02/06/2018 1800   LABSPEC 1.017 02/06/2018 1800   PHURINE 5.0 02/06/2018 1800   GLUCOSEU 50 (A) 02/06/2018 1800   HGBUR SMALL (A) 02/06/2018 1800   BILIRUBINUR NEGATIVE 02/06/2018 1800   KETONESUR 5 (A) 02/06/2018 1800   PROTEINUR NEGATIVE 02/06/2018 1800   NITRITE NEGATIVE 02/06/2018 1800   LEUKOCYTESUR LARGE (A) 02/06/2018 1800    Radiological Exams on Admission: Dg Chest 2 View  Result Date: 02/06/2018 CLINICAL DATA:  Confusion and weakness for 1 month EXAM: CHEST - 2 VIEW COMPARISON:  01/28/2018 FINDINGS: No acute airspace disease. No pleural effusion. Normal heart size with aortic atherosclerosis. No pneumothorax. Degenerative changes of the spine. IMPRESSION: No active cardiopulmonary disease. Electronically Signed   By: Donavan Foil M.D.   On: 02/06/2018 16:16   Ct Head Wo Contrast  Result Date: 02/06/2018 CLINICAL DATA:  Weakness for a month.  Hypotension.  Confusion. EXAM: CT HEAD WITHOUT CONTRAST TECHNIQUE: Contiguous axial images were obtained from the base of the skull through the vertex without  intravenous contrast. COMPARISON:  October 18, 2017 FINDINGS: Brain: No subdural, epidural, or subarachnoid hemorrhage. Cerebellum, brainstem, and basal cisterns are normal. Ventricles and sulci are stable. Stump matter changes are stable. No acute cortical ischemia or infarct. No mass effect or midline shift. Prominent perivascular spaces versus lacunar infarcts in the left inferior basal ganglia. These are favored to represent prominent perivascular spaces. Vascular: Calcified atherosclerosis in the intracranial carotids. Skull: Normal. Negative for fracture or focal lesion. Sinuses/Orbits: No acute finding. Other: None. IMPRESSION: No acute intracranial abnormalities to explain the patient's symptoms. Electronically Signed   By: Dorise Bullion III M.D   On: 02/06/2018 18:06    EKG: Independently reviewed.  Vent. rate 80 BPM PR interval 152 ms QRS duration 78 ms QT/QTc 414/477 ms P-R-T axes 24 -22 21 Normal sinus rhythm  Assessment/Plan Principal Problem:   Sepsis secondary to UTI ([place]) Admit to telemetry/inpatient. Supplemental oxygen as needed. Continue time-limited fluids. Monitor blood pressure closely. Ceftriaxone 2 g IVPB every 24 hours. Follow-up blood cultures and sensitivity. Follow-up urine culture and sensitivity.  Active Problems:   Lactic acidosis Resolved after fluids and antibiotics.    Syncope Secondary to hypotension, sepsis and weakness. I will however check echocardiogram and carotid Doppler.    Hypokalemia Replacing. Follow-up potassium level.    Hypoglycemia Resolved.  Now she is hyperglycemic. Will resume Amaryl and Glucophage.    Dementia Continue Aricept 10 mg every evening. Continue Namenda X are 21 mg every evening.    Diabetes mellitus without complication ([facility]) Carbohydrate modified diet. Continue Amaryl 2 mg p.o. daily. Continue metformin 1500 mg daily in 2 divided doses. CBG monitoring before meals and bedtime. Check hemoglobin A1c in  a.m.    Hypertension Hold lisinopril due to earlier hypotension. Monitor blood pressure, renal function and electrolytes.   DVT prophylaxis: Lovenox SQ. Code Status: Full code. Family Communication: Her daughter was present in the room. Disposition Plan: Admit for sepsis/UTI/syncope further workup and treatment. Consults called:  Admission status: Inpatient/telemetry.   Reubin Milan MD [facility] Pager [phone].  If 7PM-7AM, please contact night-coverage [email or web address] Password [facility]  02/06/2018, 8:58 PM

## 2018-02-06 NOTE — ED Triage Notes (Signed)
Pt in lab for blood draw, pt became unresponsive with blank stare, once triage nurse and another nurse arrived pt with nausea and emesis, bp 145/76 and hr 103 via dinamap.

## 2018-02-07 ENCOUNTER — Inpatient Hospital Stay (HOSPITAL_COMMUNITY): Payer: Medicare Other

## 2018-02-07 DIAGNOSIS — R55 Syncope and collapse: Secondary | ICD-10-CM

## 2018-02-07 DIAGNOSIS — E162 Hypoglycemia, unspecified: Secondary | ICD-10-CM

## 2018-02-07 DIAGNOSIS — N390 Urinary tract infection, site not specified: Secondary | ICD-10-CM

## 2018-02-07 DIAGNOSIS — A419 Sepsis, unspecified organism: Principal | ICD-10-CM

## 2018-02-07 DIAGNOSIS — E872 Acidosis, unspecified: Secondary | ICD-10-CM | POA: Diagnosis present

## 2018-02-07 LAB — CBC WITH DIFFERENTIAL/PLATELET
Basophils Absolute: 0 10*3/uL (ref 0.0–0.1)
Basophils Relative: 0 %
Eosinophils Absolute: 0.1 10*3/uL (ref 0.0–0.7)
Eosinophils Relative: 1 %
HCT: 35.5 % — ABNORMAL LOW (ref 36.0–46.0)
Hemoglobin: 11.5 g/dL — ABNORMAL LOW (ref 12.0–15.0)
Lymphocytes Relative: 28 %
Lymphs Abs: 2.7 10*3/uL (ref 0.7–4.0)
MCH: 31.2 pg (ref 26.0–34.0)
MCHC: 32.4 g/dL (ref 30.0–36.0)
MCV: 96.2 fL (ref 78.0–100.0)
Monocytes Absolute: 0.9 10*3/uL (ref 0.1–1.0)
Monocytes Relative: 9 %
Neutro Abs: 6 10*3/uL (ref 1.7–7.7)
Neutrophils Relative %: 62 %
Platelets: 211 10*3/uL (ref 150–400)
RBC: 3.69 MIL/uL — ABNORMAL LOW (ref 3.87–5.11)
RDW: 14.3 % (ref 11.5–15.5)
WBC: 9.7 10*3/uL (ref 4.0–10.5)

## 2018-02-07 LAB — BASIC METABOLIC PANEL
ANION GAP: 14 (ref 5–15)
BUN: 26 mg/dL — ABNORMAL HIGH (ref 6–20)
CALCIUM: 8 mg/dL — AB (ref 8.9–10.3)
CO2: 26 mmol/L (ref 22–32)
Chloride: 94 mmol/L — ABNORMAL LOW (ref 101–111)
Creatinine, Ser: 1.11 mg/dL — ABNORMAL HIGH (ref 0.44–1.00)
GFR, EST AFRICAN AMERICAN: 53 mL/min — AB (ref >60)
GFR, EST NON AFRICAN AMERICAN: 46 mL/min — AB (ref >60)
GLUCOSE: 205 mg/dL — AB (ref 65–99)
POTASSIUM: 3.1 mmol/L — AB (ref 3.5–5.1)
Sodium: 134 mmol/L — ABNORMAL LOW (ref 135–145)

## 2018-02-07 LAB — GLUCOSE, CAPILLARY
GLUCOSE-CAPILLARY: 177 mg/dL — AB (ref 65–99)
Glucose-Capillary: 225 mg/dL — ABNORMAL HIGH (ref 65–99)
Glucose-Capillary: 237 mg/dL — ABNORMAL HIGH (ref 65–99)
Glucose-Capillary: 241 mg/dL — ABNORMAL HIGH (ref 65–99)
Glucose-Capillary: 251 mg/dL — ABNORMAL HIGH (ref 65–99)
Glucose-Capillary: 379 mg/dL — ABNORMAL HIGH (ref 65–99)

## 2018-02-07 LAB — ECHOCARDIOGRAM COMPLETE
Height: 61.5 in
WEIGHTICAEL: 2544 [oz_av]

## 2018-02-07 LAB — HEMOGLOBIN A1C
Hgb A1c MFr Bld: 6 % — ABNORMAL HIGH (ref 4.8–5.6)
MEAN PLASMA GLUCOSE: 125.5 mg/dL

## 2018-02-07 MED ORDER — GLUCERNA SHAKE PO LIQD
237.0000 mL | Freq: Three times a day (TID) | ORAL | Status: DC
  Administered 2018-02-07 – 2018-02-08 (×4): 237 mL via ORAL

## 2018-02-07 MED ORDER — LISINOPRIL 10 MG PO TABS
10.0000 mg | ORAL_TABLET | Freq: Every day | ORAL | Status: DC
  Administered 2018-02-07 – 2018-02-08 (×2): 10 mg via ORAL
  Filled 2018-02-07 (×2): qty 1

## 2018-02-07 MED ORDER — POTASSIUM CHLORIDE 20 MEQ/15ML (10%) PO SOLN
40.0000 meq | Freq: Two times a day (BID) | ORAL | Status: AC
  Administered 2018-02-07 (×2): 40 meq via ORAL
  Filled 2018-02-07 (×2): qty 30

## 2018-02-07 MED ORDER — SODIUM CHLORIDE 0.45 % IV SOLN
INTRAVENOUS | Status: DC
  Administered 2018-02-07: 03:00:00 via INTRAVENOUS

## 2018-02-07 NOTE — Progress Notes (Addendum)
PROGRESS NOTE    Saddie Sandeen  UKG:[id number] DOB: 11/05/1934 DOA: 02/06/2018 PCP: Glenda Chroman, MD   Brief Narrative:   Victoria Christian is a 83 y.o. female with medical history significant of dementia, type 2 diabetes, hypertension who is brought to the emergency department due to weakness, hypotension and confusion.  Her daughter states that she has been feeling weak for about a month or so.  She was admitted with suspected sepsis secondary to UTI with associated lactic acidosis.  She was also noted to have syncope as well as electrolyte abnormalities to include hypokalemia with her recent bout of diarrhea.  She also has dementia.   Assessment & Plan:   Principal Problem:   Sepsis secondary to UTI [facility]) Active Problems:   Syncope   Hypokalemia   Dementia   Diabetes mellitus without complication (HCC)   Hypertension   Lactic acidosis   Hypoglycemia   1. Sepsis secondary to UTI versus pyuria.  Continue empiric Rocephin as ordered and await further urine culture results.  Lactic acidosis has improved on repeat labs.  No leukocytosis currently noted on CBC.  Patient is otherwise asymptomatic. 2. Weakness with syncopal episode.  2D echo as well as carotid Dopplers pending.  Will check orthostatics this morning to assess fluid status and consider PT evaluation thereafter. 3. Hypokalemia.  Undergoing aggressive repletion IV n.p.o.  Recheck labs in a.m. with magnesium. 4. Poor appetite.  Start Glucerna shakes and consider appetite stimulation.  Nutrition consultation. 5. Hypoglycemia with associated diabetes.  Currently resolved with resumption of home Amaryl and Glucophage.  Continue carb modified diet. 6. Hypertension.  Lisinopril held due to hypotension.  Will resume at this time since blood pressure is starting to elevate.   DVT prophylaxis: Lovenox Code Status: Full Family Communication: Daughter at bedside Disposition Plan: Continue IV fluid hydration and correction of potassium and  treatment of UTI.  Eventual PT evaluation for weakness with possible need for placement.   Consultants:   None  Procedures:   None  Antimicrobials:   Rocephin 4/11->   Subjective: Patient seen and evaluated today with no new acute complaints or concerns. No acute concerns or events noted overnight.  She continues to have a poor appetite but would like to have some Glucerna shakes.  Objective: Vitals:   02/06/18 2000 02/06/18 2159 02/06/18 2201 02/07/18 0426  BP: 134/61  121/67 (!) 134/56  Pulse:   96 77  Resp: 18  16 18   Temp:   98.2 F (36.8 C) 98.1 F (36.7 C)  TempSrc:   Oral Oral  SpO2:   100% 99%  Weight:  72.1 kg (159 lb)    Height:  5' 1.5" (1.562 m)      Intake/Output Summary (Last 24 hours) at 02/07/2018 0921 Last data filed at 02/07/2018 0300 Gross per 24 hour  Intake 2181.25 ml  Output -  Net 2181.25 ml   Filed Weights   02/06/18 1436 02/06/18 2159  Weight: 72.1 kg (159 lb) 72.1 kg (159 lb)    Examination:  General exam: Appears calm and comfortable  Respiratory system: Clear to auscultation. Respiratory effort normal. Cardiovascular system: S1 & S2 heard, RRR. No JVD, murmurs, rubs, gallops or clicks. No pedal edema. Gastrointestinal system: Abdomen is nondistended, soft and nontender. No organomegaly or masses felt. Normal bowel sounds heard. Central nervous system: Alert and oriented. No focal neurological deficits. Extremities: Symmetric 5 x 5 power. Skin: No rashes, lesions or ulcers    Data Reviewed: I have personally reviewed  following labs and imaging studies  CBC: Recent Labs  Lab 02/06/18 1503 02/07/18 0614  WBC 13.9* 9.7  NEUTROABS 9.4* 6.0  HGB 13.2 11.5*  HCT 41.6 35.5*  MCV 97.0 96.2  PLT 269 834   Basic Metabolic Panel: Recent Labs  Lab 02/06/18 1503 02/07/18 0614  NA 137 134*  K 3.1* 3.1*  CL 91* 94*  CO2 25 26  GLUCOSE 55* 205*  BUN 31* 26*  CREATININE 1.49* 1.11*  CALCIUM 9.4 8.0*  MG 1.9  --     GFR: Estimated Creatinine Clearance: 37.1 mL/min (A) (by C-G formula based on SCr of 1.11 mg/dL (H)). Liver Function Tests: Recent Labs  Lab 02/06/18 1530  AST 32  ALT 17  ALKPHOS 60  BILITOT 0.9  PROT 6.5  ALBUMIN 3.3*   Recent Labs  Lab 02/06/18 1530  LIPASE 75*   No results for input(s): AMMONIA in the last 168 hours. Coagulation Profile: No results for input(s): INR, PROTIME in the last 168 hours. Cardiac Enzymes: Recent Labs  Lab 02/06/18 1503  TROPONINI <0.03   BNP (last 3 results) No results for input(s): PROBNP in the last 8760 hours. HbA1C: No results for input(s): HGBA1C in the last 72 hours. CBG: Recent Labs  Lab 02/07/18 0042 02/07/18 0425 02/07/18 0744  GLUCAP 379* 251* 177*   Lipid Profile: No results for input(s): CHOL, HDL, LDLCALC, TRIG, CHOLHDL, LDLDIRECT in the last 72 hours. Thyroid Function Tests: No results for input(s): TSH, T4TOTAL, FREET4, T3FREE, THYROIDAB in the last 72 hours. Anemia Panel: No results for input(s): VITAMINB12, FOLATE, FERRITIN, TIBC, IRON, RETICCTPCT in the last 72 hours. Sepsis Labs: Recent Labs  Lab 02/06/18 1605 02/06/18 1844  LATICACIDVEN 7.83* <0.30*    Recent Results (from the past 240 hour(s))  Blood Culture (routine x 2)     Status: None (Preliminary result)   Collection Time: 02/06/18  4:30 PM  Result Value Ref Range Status   Specimen Description RIGHT ANTECUBITAL  Final   Special Requests   Final    BOTTLES DRAWN AEROBIC AND ANAEROBIC Blood Culture adequate volume   Culture   Final    NO GROWTH < 24 HOURS Performed at [facility], [address], [place], [place] [address]    Report Status PENDING  Incomplete  Blood Culture (routine x 2)     Status: None (Preliminary result)   Collection Time: 02/06/18  4:30 PM  Result Value Ref Range Status   Specimen Description LEFT ANTECUBITAL  Final   Special Requests   Final    BOTTLES DRAWN AEROBIC AND ANAEROBIC Blood Culture results may not be  optimal due to an inadequate volume of blood received in culture bottles   Culture   Final    NO GROWTH < 24 HOURS Performed at [facility], [address] [place],  [address]    Report Status PENDING  Incomplete      Radiology Studies: Dg Chest 2 View  Result Date: 02/06/2018 CLINICAL DATA:  Confusion and weakness for 1 month EXAM: CHEST - 2 VIEW COMPARISON:  01/28/2018 FINDINGS: No acute airspace disease. No pleural effusion. Normal heart size with aortic atherosclerosis. No pneumothorax. Degenerative changes of the spine. IMPRESSION: No active cardiopulmonary disease. Electronically Signed   By: Donavan Foil M.D.   On: 02/06/2018 16:16   Ct Head Wo Contrast  Result Date: 02/06/2018 CLINICAL DATA:  Weakness for a month.  Hypotension.  Confusion. EXAM: CT HEAD WITHOUT CONTRAST TECHNIQUE: Contiguous axial images were obtained from  the base of the skull through the vertex without intravenous contrast. COMPARISON:  October 18, 2017 FINDINGS: Brain: No subdural, epidural, or subarachnoid hemorrhage. Cerebellum, brainstem, and basal cisterns are normal. Ventricles and sulci are stable. Polhemus matter changes are stable. No acute cortical ischemia or infarct. No mass effect or midline shift. Prominent perivascular spaces versus lacunar infarcts in the left inferior basal ganglia. These are favored to represent prominent perivascular spaces. Vascular: Calcified atherosclerosis in the intracranial carotids. Skull: Normal. Negative for fracture or focal lesion. Sinuses/Orbits: No acute finding. Other: None. IMPRESSION: No acute intracranial abnormalities to explain the patient's symptoms. Electronically Signed   By: Dorise Bullion III M.D   On: 02/06/2018 18:06      Scheduled Meds: . donepezil  10 mg Oral QPM  . enoxaparin (LOVENOX) injection  40 mg Subcutaneous Q24H  . loratadine  10 mg Oral Daily  . magnesium gluconate  500 mg Oral QPM  . memantine  21 mg Oral QPM  . potassium chloride   40 mEq Oral BID   Continuous Infusions: . cefTRIAXone (ROCEPHIN)  IV Stopped (02/06/18 2313)     LOS: 1 day    Time spent: 30 minutes    Holley Wirt Darleen Crocker, DO [facility] Pager [phone]  If 7PM-7AM, please contact night-coverage [email or web address] Password [facility] 02/07/2018, 9:21 AM

## 2018-02-07 NOTE — Progress Notes (Signed)
  Echocardiogram 2D Echocardiogram has been performed.  Victoria Christian 02/07/2018, 10:33 AM

## 2018-02-07 NOTE — Care Management Important Message (Signed)
Important Message  Patient Details  Name: Victoria Christian MRN: [id number] Date of Birth: 04/12/1935   Medicare Important Message Given:  Yes    Shelda Altes 02/07/2018, 12:43 PM

## 2018-02-07 NOTE — Plan of Care (Signed)
progressing 

## 2018-02-07 NOTE — Progress Notes (Signed)
Initial Nutrition Assessment  DOCUMENTATION CODES:  Not applicable  INTERVENTION:  Continue Glucerna TID  Will monitor PO intake and f/u as needed.   NUTRITION DIAGNOSIS:  Increased nutrient needs related to acute illness(Sepsis 2/2 UTI) as evidenced by estimated nutritional requirements for this condition  GOAL:  Patient will meet greater than or equal to 90% of their needs  MONITOR:  PO intake, Supplement acceptance, Diet advancement, Labs, Weight trends, I & O's  REASON FOR ASSESSMENT:  Consult Assessment of nutrition requirement/status  ASSESSMENT:  83 y/o female PMHx dementia, DM2, HTN. Has had weakness x 1 month. Recently treated for strep throat. Pt declined and day of admission had poor intake and Brought to ED due to weakness, confusion, hypotension. Worked up for sepsis 2/2 UTI and admitted for management.   Pt is alone on RD arrival. She is pleasantly confused and a poor historian, providing ambiguous answers almost entirely.   She says she "doesn't think" she has been eating well this past month. She cannot say why. She denies any n/v/c/d. She doesn't know what type of diet she follows, if any, at home. She doesn't think she takes any vitamins.   She believes her UBW is 169 lbs. Todays bed weight is 166 lbs. Unclear if her listed weight of 159 lbs was reported or was just estimated by the patient.   She currently has no complaints. She does not know if she ate well at breakfast or lunch, but says she "doesn't think so". Intake records so she ate 50% of Breakfast. She is agreeable to continuing Glucerna.   Physical Exam: Mild muscle wasting of temporalis. Mild fat wasting of orbitals. The rest of exam is WDL  Labs A1C today: 6.0, Bgs today high:177-250. K still 3.1, Albumin: 3.3, WBC improved-wdl. BUN/creat: 39/1.49->26/1.11 Meds: Glucerna TID, Namenda, KCL, IV abx, IVF  Recent Labs  Lab 02/06/18 1503 02/07/18 0614  NA 137 134*  K 3.1* 3.1*  CL 91* 94*  CO2 25  26  BUN 31* 26*  CREATININE 1.49* 1.11*  CALCIUM 9.4 8.0*  MG 1.9  --   GLUCOSE 55* 205*   NUTRITION - FOCUSED PHYSICAL EXAM:   Most Recent Value  Orbital Region  Mild depletion  Upper Arm Region  No depletion  Thoracic and Lumbar Region  No depletion  Buccal Region  No depletion  Temple Region  Mild depletion  Clavicle Bone Region  No depletion  Clavicle and Acromion Bone Region  No depletion  Scapular Bone Region  No depletion  Dorsal Hand  No depletion  Patellar Region  No depletion  Anterior Thigh Region  No depletion  Posterior Calf Region  No depletion     Diet Order:  Diet heart healthy/carb modified Room service appropriate? Yes; Fluid consistency: Thin  EDUCATION NEEDS:  No education needs have been identified at this time  Skin:  Skin Assessment: Reviewed RN Assessment  Last BM:  4/11  Height:  Ht Readings from Last 1 Encounters:  02/06/18 5' 1.5" (1.562 m)   Weight: Wt Readings from Last 1 Encounters:  02/06/18 159 lb (72.1 kg)   Ideal Body Weight:  48.86 kg  BMI:  Body mass index is 29.56 kg/m.  Estimated Nutritional Needs:  Kcal:  1500-1750 kcals (21-24 kcal/kg bw) Protein:  65-75 g Pro (1.3-1.5 g/kg ibw) Fluid:  1.8 L fluid (25 ml/kg bw)  Burtis Junes RD, LDN, CNSC Clinical Nutrition Available Tues-Sat via Pager: [phone] 02/07/2018 2:50 PM

## 2018-02-08 LAB — CBC WITH DIFFERENTIAL/PLATELET
BASOS ABS: 0 10*3/uL (ref 0.0–0.1)
Basophils Relative: 0 %
EOS ABS: 0.2 10*3/uL (ref 0.0–0.7)
Eosinophils Relative: 2 %
HCT: 36 % (ref 36.0–46.0)
Hemoglobin: 11.6 g/dL — ABNORMAL LOW (ref 12.0–15.0)
LYMPHS PCT: 31 %
Lymphs Abs: 2.4 10*3/uL (ref 0.7–4.0)
MCH: 31.1 pg (ref 26.0–34.0)
MCHC: 32.2 g/dL (ref 30.0–36.0)
MCV: 96.5 fL (ref 78.0–100.0)
Monocytes Absolute: 0.7 10*3/uL (ref 0.1–1.0)
Monocytes Relative: 10 %
NEUTROS PCT: 57 %
Neutro Abs: 4.5 10*3/uL (ref 1.7–7.7)
PLATELETS: 197 10*3/uL (ref 150–400)
RBC: 3.73 MIL/uL — AB (ref 3.87–5.11)
RDW: 14.3 % (ref 11.5–15.5)
WBC: 7.8 10*3/uL (ref 4.0–10.5)

## 2018-02-08 LAB — URINE CULTURE: Special Requests: NORMAL

## 2018-02-08 LAB — GLUCOSE, CAPILLARY
Glucose-Capillary: 116 mg/dL — ABNORMAL HIGH (ref 65–99)
Glucose-Capillary: 189 mg/dL — ABNORMAL HIGH (ref 65–99)

## 2018-02-08 LAB — MAGNESIUM: MAGNESIUM: 1.3 mg/dL — AB (ref 1.7–2.4)

## 2018-02-08 LAB — BASIC METABOLIC PANEL
ANION GAP: 12 (ref 5–15)
BUN: 19 mg/dL (ref 6–20)
CO2: 27 mmol/L (ref 22–32)
Calcium: 8.6 mg/dL — ABNORMAL LOW (ref 8.9–10.3)
Chloride: 99 mmol/L — ABNORMAL LOW (ref 101–111)
Creatinine, Ser: 0.86 mg/dL (ref 0.44–1.00)
GFR calc Af Amer: >60 mL/min (ref >60)
Glucose, Bld: 129 mg/dL — ABNORMAL HIGH (ref 65–99)
POTASSIUM: 3.5 mmol/L (ref 3.5–5.1)
SODIUM: 138 mmol/L (ref 135–145)

## 2018-02-08 MED ORDER — NITROFURANTOIN MONOHYD MACRO 100 MG PO CAPS: 100.0000 mg | ORAL_CAPSULE | Freq: Two times a day (BID) | ORAL | 0 refills | Status: AC

## 2018-02-08 MED ORDER — GLUCERNA SHAKE PO LIQD: 237.0000 mL | Freq: Three times a day (TID) | ORAL | 0 refills | Status: AC

## 2018-02-08 MED ORDER — MAGNESIUM SULFATE 2 GM/50ML IV SOLN: 2.0000 g | Freq: Once | INTRAVENOUS | Status: DC

## 2018-02-08 NOTE — Plan of Care (Signed)
progressing 

## 2018-02-08 NOTE — Discharge Summary (Signed)
Physician Discharge Summary  Victoria Christian CVE:[id number] DOB: Aug 09, 1935 DOA: 02/06/2018  PCP: Glenda Chroman, MD  Admit date: 02/06/2018  Discharge date: 02/08/2018  Admitted From:Home  Disposition:  Home  Recommendations for Outpatient Follow-up:  1. Follow up with PCP in 1-2 weeks  Home Health:Yes with PT  Equipment/Devices:None  Discharge Condition:Stable  CODE STATUS: Full  Diet recommendation: Heart Healthy  Brief/Interim Summary:  Victoria Christian a 83 y.o.femalewith medical history significant ofdementia, type 2 diabetes, hypertension who is brought to the emergency department due toweakness, hypotension and confusion. Her daughter states that she has been feeling weak for about a month or so.  She was admitted with suspected sepsis secondary to UTI with associated lactic acidosis.  She was also noted to have syncope as well as electrolyte abnormalities to include hypokalemia with her recent bout of diarrhea.  She also has dementia.  Her electrolyte abnormalities have improved and her urine cultures have not demonstrated any growth.  She will be discharged on nitrofurantoin for 5 more days to complete course of treatment and will be set up with home health physical therapy on discharge.  Her 2D echocardiogram as well as carotid ultrasounds were unrevealing.  She will follow-up with her primary care provider in the next 1 week.   Discharge Diagnoses:  Principal Problem:   Sepsis secondary to UTI [facility]) Active Problems:   Syncope   Hypokalemia   Dementia   Diabetes mellitus without complication (HCC)   Hypertension   Lactic acidosis   Hypoglycemia  1. Sepsis secondary to UTI versus pyuria.    No growth and urine cultures noted.  She is otherwise asymptomatic and will remain on nitrofurantoin for 5 more days to complete course of treatment. 2. Weakness with syncopal episode.  Patient has been ambulated with no further symptomatology and does not require home oxygen.  2D  echocardiogram with EF 60-65%, mild LVH, and grade 1 diastolic dysfunction; as well as carotid Dopplers without any acute abnormalities.  She will have home health physical therapy set up for her on discharge. 3. Hypokalemia.  Repleted.  Patient does have some residual hypomagnesemia, but does take magnesium supplements at home. 4. Poor appetite.  Start Glucerna shakes which she is tolerating well.  Appetite has actually improved during this admission. 5. Hypoglycemia with associated diabetes.    Continue home Amaryl and Glucophage.  Hypoglycemia has resolved. 6. Hypertension.    Continue home medications.     Discharge Instructions  Discharge Instructions    Diet - low sodium heart healthy   Complete by:  As directed    Increase activity slowly   Complete by:  As directed      Allergies as of 02/08/2018   No Known Allergies     Medication List    STOP taking these medications   levofloxacin 500 MG tablet Commonly known as:  LEVAQUIN     TAKE these medications   cetirizine 10 MG tablet Commonly known as:  ZYRTEC Take 10 mg by mouth every evening.   donepezil 10 MG tablet Commonly known as:  ARICEPT Take 10 mg by mouth every evening.   feeding supplement (GLUCERNA SHAKE) Liqd Take 237 mLs by mouth 3 (three) times daily between meals.   glimepiride 2 MG tablet Commonly known as:  AMARYL Take 2 mg by mouth every morning.   lisinopril 10 MG tablet Commonly known as:  PRINIVIL,ZESTRIL Take 10 mg by mouth every morning.   magnesium gluconate 500 MG tablet Commonly known as:  MAGONATE Take 500 mg by mouth every evening.   Memantine HCl ER 21 MG Cp24 Take 1 capsule by mouth every evening.   metFORMIN 500 MG tablet Commonly known as:  GLUCOPHAGE Take 500-1,000 mg by mouth See admin instructions. Two tablets in the morning and one tablet in the evening   nitrofurantoin (macrocrystal-monohydrate) 100 MG capsule Commonly known as:  MACROBID Take 1 capsule (100 mg  total) by mouth 2 (two) times daily for 5 days.      Follow-up Information    Vyas, Costella Hatcher, MD Follow up.   Specialty:  Internal Medicine Contact information: [place] [address] [phone]          No Known Allergies  Consultations:  None   Procedures/Studies: Dg Chest 2 View  Result Date: 02/06/2018 CLINICAL DATA:  Confusion and weakness for 1 month EXAM: CHEST - 2 VIEW COMPARISON:  01/28/2018 FINDINGS: No acute airspace disease. No pleural effusion. Normal heart size with aortic atherosclerosis. No pneumothorax. Degenerative changes of the spine. IMPRESSION: No active cardiopulmonary disease. Electronically Signed   By: Donavan Foil M.D.   On: 02/06/2018 16:16   Ct Head Wo Contrast  Result Date: 02/06/2018 CLINICAL DATA:  Weakness for a month.  Hypotension.  Confusion. EXAM: CT HEAD WITHOUT CONTRAST TECHNIQUE: Contiguous axial images were obtained from the base of the skull through the vertex without intravenous contrast. COMPARISON:  October 18, 2017 FINDINGS: Brain: No subdural, epidural, or subarachnoid hemorrhage. Cerebellum, brainstem, and basal cisterns are normal. Ventricles and sulci are stable. Padovano matter changes are stable. No acute cortical ischemia or infarct. No mass effect or midline shift. Prominent perivascular spaces versus lacunar infarcts in the left inferior basal ganglia. These are favored to represent prominent perivascular spaces. Vascular: Calcified atherosclerosis in the intracranial carotids. Skull: Normal. Negative for fracture or focal lesion. Sinuses/Orbits: No acute finding. Other: None. IMPRESSION: No acute intracranial abnormalities to explain the patient's symptoms. Electronically Signed   By: Dorise Bullion III M.D   On: 02/06/2018 18:06   US Carotid Bilateral  Result Date: 02/07/2018 CLINICAL DATA:  Syncope and weakness. EXAM: BILATERAL CAROTID DUPLEX ULTRASOUND TECHNIQUE: Pearline Cables scale imaging, color Doppler and duplex  ultrasound were performed of bilateral carotid and vertebral arteries in the neck. COMPARISON:  None. FINDINGS: Criteria: Quantification of carotid stenosis is based on velocity parameters that correlate the residual internal carotid diameter with NASCET-based stenosis levels, using the diameter of the distal internal carotid lumen as the denominator for stenosis measurement. The following velocity measurements were obtained: RIGHT ICA:  68 cm/sec CCA:  95 cm/sec SYSTOLIC ICA/CCA RATIO:  0.7 DIASTOLIC ICA/CCA RATIO:  1.1 ECA:  98 cm/sec LEFT ICA:  66 cm/sec CCA:  85 cm/sec SYSTOLIC ICA/CCA RATIO:  0.8 DIASTOLIC ICA/CCA RATIO:  1.6 ECA:  79 cm/sec RIGHT CAROTID ARTERY: Minimal plaque or intimal thickening in the internal carotid artery. Normal waveforms and velocities in the internal carotid artery. External carotid artery is patent with normal waveform. RIGHT VERTEBRAL ARTERY: Antegrade flow and normal waveform in the right vertebral artery. LEFT CAROTID ARTERY: Minimal plaque or intimal thickening at the left carotid bulb. External carotid artery is patent with normal waveform. Normal waveforms and velocities in the internal carotid artery. LEFT VERTEBRAL ARTERY: Antegrade flow and normal waveform in the left vertebral artery. IMPRESSION: Minimal atherosclerotic disease identified in the carotid arteries. No significant carotid artery stenosis. Patent vertebral arteries with antegrade flow. Electronically Signed   By: Markus Daft M.D.   On:  02/07/2018 14:42   Discharge Exam: Vitals:   02/07/18 2120 02/08/18 0606  BP: (!) 137/55 136/68  Pulse: 80 75  Resp: 16 16  Temp: (!) 97.5 F (36.4 C) 98.3 F (36.8 C)  SpO2: 99% 97%   Vitals:   02/07/18 0426 02/07/18 1311 02/07/18 2120 02/08/18 0606  BP: (!) 134/56 135/70 (!) 137/55 136/68  Pulse: 77 83 80 75  Resp: 18 17 16 16   Temp: 98.1 F (36.7 C) 98 F (36.7 C) (!) 97.5 F (36.4 C) 98.3 F (36.8 C)  TempSrc: Oral Oral Oral Oral  SpO2: 99% 99% 99% 97%   Weight:      Height:        General: Pt is alert, awake, not in acute distress Cardiovascular: RRR, S1/S2 +, no rubs, no gallops Respiratory: CTA bilaterally, no wheezing, no rhonchi Abdominal: Soft, NT, ND, bowel sounds + Extremities: no edema, no cyanosis    The results of significant diagnostics from this hospitalization (including imaging, microbiology, ancillary and laboratory) are listed below for reference.     Microbiology: Recent Results (from the past 240 hour(s))  Blood Culture (routine x 2)     Status: None (Preliminary result)   Collection Time: 02/06/18  4:30 PM  Result Value Ref Range Status   Specimen Description RIGHT ANTECUBITAL  Final   Special Requests   Final    BOTTLES DRAWN AEROBIC AND ANAEROBIC Blood Culture adequate volume   Culture   Final    NO GROWTH 2 DAYS Performed at [facility], [address], [place], [place] [address]    Report Status PENDING  Incomplete  Blood Culture (routine x 2)     Status: None (Preliminary result)   Collection Time: 02/06/18  4:30 PM  Result Value Ref Range Status   Specimen Description LEFT ANTECUBITAL  Final   Special Requests   Final    BOTTLES DRAWN AEROBIC AND ANAEROBIC Blood Culture results may not be optimal due to an inadequate volume of blood received in culture bottles   Culture   Final    NO GROWTH 2 DAYS Performed at [facility], [address], [place], [place] [address]    Report Status PENDING  Incomplete  Urine culture     Status: Abnormal   Collection Time: 02/06/18  6:00 PM  Result Value Ref Range Status   Specimen Description   Final    URINE, CLEAN CATCH Performed at [facility], [address], [place], [place] [address]    Special Requests   Final    Normal Performed at [facility], [address] [place], [place] [address]    Culture MULTIPLE SPECIES PRESENT, SUGGEST RECOLLECTION (A)  Final   Report Status 02/08/2018 FINAL  Final     Labs: BNP (last 3 results) No results for  input(s): BNP in the last 8760 hours. Basic Metabolic Panel: Recent Labs  Lab 02/06/18 1503 02/07/18 0614 02/08/18 0618  NA 137 134* 138  K 3.1* 3.1* 3.5  CL 91* 94* 99*  CO2 25 26 27   GLUCOSE 55* 205* 129*  BUN 31* 26* 19  CREATININE 1.49* 1.11* 0.86  CALCIUM 9.4 8.0* 8.6*  MG 1.9  --  1.3*   Liver Function Tests: Recent Labs  Lab 02/06/18 1530  AST 32  ALT 17  ALKPHOS 60  BILITOT 0.9  PROT 6.5  ALBUMIN 3.3*   Recent Labs  Lab 02/06/18 1530  LIPASE 75*   No results for input(s): AMMONIA in the last 168  hours. CBC: Recent Labs  Lab 02/06/18 1503 02/07/18 0614 02/08/18 0618  WBC 13.9* 9.7 7.8  NEUTROABS 9.4* 6.0 4.5  HGB 13.2 11.5* 11.6*  HCT 41.6 35.5* 36.0  MCV 97.0 96.2 96.5  PLT 269 211 197   Cardiac Enzymes: Recent Labs  Lab 02/06/18 1503  TROPONINI <0.03   BNP: Invalid input(s): POCBNP CBG: Recent Labs  Lab 02/07/18 1123 02/07/18 1658 02/07/18 2048 02/08/18 0737 02/08/18 1115  GLUCAP 241* 225* 237* 116* 189*   D-Dimer No results for input(s): DDIMER in the last 72 hours. Hgb A1c Recent Labs    02/07/18 0614  HGBA1C 6.0*   Lipid Profile No results for input(s): CHOL, HDL, LDLCALC, TRIG, CHOLHDL, LDLDIRECT in the last 72 hours. Thyroid function studies No results for input(s): TSH, T4TOTAL, T3FREE, THYROIDAB in the last 72 hours.  Invalid input(s): FREET3 Anemia work up No results for input(s): VITAMINB12, FOLATE, FERRITIN, TIBC, IRON, RETICCTPCT in the last 72 hours. Urinalysis    Component Value Date/Time   COLORURINE YELLOW 02/06/2018 1800   APPEARANCEUR HAZY (A) 02/06/2018 1800   LABSPEC 1.017 02/06/2018 1800   PHURINE 5.0 02/06/2018 1800   GLUCOSEU 50 (A) 02/06/2018 1800   HGBUR SMALL (A) 02/06/2018 1800   BILIRUBINUR NEGATIVE 02/06/2018 1800   KETONESUR 5 (A) 02/06/2018 1800   PROTEINUR NEGATIVE 02/06/2018 1800   NITRITE NEGATIVE 02/06/2018 1800   LEUKOCYTESUR LARGE (A) 02/06/2018 1800   Sepsis Labs Invalid  input(s): PROCALCITONIN,  WBC,  LACTICIDVEN Microbiology Recent Results (from the past 240 hour(s))  Blood Culture (routine x 2)     Status: None (Preliminary result)   Collection Time: 02/06/18  4:30 PM  Result Value Ref Range Status   Specimen Description RIGHT ANTECUBITAL  Final   Special Requests   Final    BOTTLES DRAWN AEROBIC AND ANAEROBIC Blood Culture adequate volume   Culture   Final    NO GROWTH 2 DAYS Performed at [facility], [address] [place], [place] [address]    Report Status PENDING  Incomplete  Blood Culture (routine x 2)     Status: None (Preliminary result)   Collection Time: 02/06/18  4:30 PM  Result Value Ref Range Status   Specimen Description LEFT ANTECUBITAL  Final   Special Requests   Final    BOTTLES DRAWN AEROBIC AND ANAEROBIC Blood Culture results may not be optimal due to an inadequate volume of blood received in culture bottles   Culture   Final    NO GROWTH 2 DAYS Performed at [facility], [address], [place], [place] [address]    Report Status PENDING  Incomplete  Urine culture     Status: Abnormal   Collection Time: 02/06/18  6:00 PM  Result Value Ref Range Status   Specimen Description   Final    URINE, CLEAN CATCH Performed at [facility], [address] [place], [place] [address]    Special Requests   Final    Normal Performed at [facility], [address], [place], [place] [address]    Culture MULTIPLE SPECIES PRESENT, [place] (A)  Final   Report Status 02/08/2018 FINAL  Final     Time coordinating discharge: 35 minutes  SIGNED:   Rodena Goldmann, DO [facility] 02/08/2018, 12:24 PM Pager [phone]  If 7PM-7AM, please contact night-coverage [email or web address] Password [email or web address]

## 2018-02-08 NOTE — Progress Notes (Signed)
SATURATION QUALIFICATIONS: (This note is used to comply with regulatory documentation for home oxygen)  Patient Saturations on Room Air at Rest = 100%  Patient Saturations on Room Air while Ambulating = 97%  Patient Saturations on 0 Liters of oxygen while Ambulating = na%  Please briefly explain why patient needs home oxygen: I did not walk the patient with oxygen because her oxygen saturation did not drop below 97 while ambulating on room air.

## 2018-02-08 NOTE — Progress Notes (Signed)
Removed IV-clean, dry, intact. Reviewed new medications and home medications with daughter and pt. Answered all questions. Wheeled stable pt and belongings to main entrance where she was picked up by daughter.

## 2018-02-09 DIAGNOSIS — S299XXA Unspecified injury of thorax, initial encounter: Secondary | ICD-10-CM | POA: Diagnosis not present

## 2018-02-09 DIAGNOSIS — R197 Diarrhea, unspecified: Secondary | ICD-10-CM | POA: Diagnosis not present

## 2018-02-09 DIAGNOSIS — J309 Allergic rhinitis, unspecified: Secondary | ICD-10-CM | POA: Diagnosis not present

## 2018-02-09 DIAGNOSIS — N390 Urinary tract infection, site not specified: Secondary | ICD-10-CM | POA: Diagnosis present

## 2018-02-09 DIAGNOSIS — R531 Weakness: Secondary | ICD-10-CM | POA: Diagnosis not present

## 2018-02-09 DIAGNOSIS — F0280 Dementia in other diseases classified elsewhere without behavioral disturbance: Secondary | ICD-10-CM | POA: Diagnosis present

## 2018-02-09 DIAGNOSIS — J209 Acute bronchitis, unspecified: Secondary | ICD-10-CM | POA: Diagnosis present

## 2018-02-09 DIAGNOSIS — K921 Melena: Secondary | ICD-10-CM | POA: Diagnosis not present

## 2018-02-09 DIAGNOSIS — I10 Essential (primary) hypertension: Secondary | ICD-10-CM | POA: Diagnosis present

## 2018-02-09 DIAGNOSIS — Z87440 Personal history of urinary (tract) infections: Secondary | ICD-10-CM | POA: Diagnosis not present

## 2018-02-09 DIAGNOSIS — R195 Other fecal abnormalities: Secondary | ICD-10-CM | POA: Diagnosis not present

## 2018-02-09 DIAGNOSIS — F0390 Unspecified dementia without behavioral disturbance: Secondary | ICD-10-CM | POA: Diagnosis not present

## 2018-02-09 DIAGNOSIS — M810 Age-related osteoporosis without current pathological fracture: Secondary | ICD-10-CM | POA: Diagnosis present

## 2018-02-09 DIAGNOSIS — R2689 Other abnormalities of gait and mobility: Secondary | ICD-10-CM | POA: Diagnosis not present

## 2018-02-09 DIAGNOSIS — Z8673 Personal history of transient ischemic attack (TIA), and cerebral infarction without residual deficits: Secondary | ICD-10-CM | POA: Diagnosis not present

## 2018-02-09 DIAGNOSIS — M6281 Muscle weakness (generalized): Secondary | ICD-10-CM | POA: Diagnosis not present

## 2018-02-09 DIAGNOSIS — R296 Repeated falls: Secondary | ICD-10-CM | POA: Diagnosis not present

## 2018-02-09 DIAGNOSIS — Z7984 Long term (current) use of oral hypoglycemic drugs: Secondary | ICD-10-CM | POA: Diagnosis not present

## 2018-02-09 DIAGNOSIS — K922 Gastrointestinal hemorrhage, unspecified: Secondary | ICD-10-CM | POA: Diagnosis present

## 2018-02-09 DIAGNOSIS — R404 Transient alteration of awareness: Secondary | ICD-10-CM | POA: Diagnosis not present

## 2018-02-09 DIAGNOSIS — Z79899 Other long term (current) drug therapy: Secondary | ICD-10-CM | POA: Diagnosis not present

## 2018-02-09 DIAGNOSIS — E860 Dehydration: Secondary | ICD-10-CM | POA: Diagnosis present

## 2018-02-09 DIAGNOSIS — R41841 Cognitive communication deficit: Secondary | ICD-10-CM | POA: Diagnosis not present

## 2018-02-09 DIAGNOSIS — Z9181 History of falling: Secondary | ICD-10-CM | POA: Diagnosis not present

## 2018-02-09 DIAGNOSIS — Z888 Allergy status to other drugs, medicaments and biological substances status: Secondary | ICD-10-CM | POA: Diagnosis not present

## 2018-02-09 DIAGNOSIS — R634 Abnormal weight loss: Secondary | ICD-10-CM | POA: Diagnosis not present

## 2018-02-09 DIAGNOSIS — G309 Alzheimer's disease, unspecified: Secondary | ICD-10-CM | POA: Diagnosis not present

## 2018-02-09 DIAGNOSIS — E119 Type 2 diabetes mellitus without complications: Secondary | ICD-10-CM | POA: Diagnosis present

## 2018-02-11 LAB — CULTURE, BLOOD (ROUTINE X 2)
CULTURE: NO GROWTH
Culture: NO GROWTH
SPECIAL REQUESTS: ADEQUATE

## 2018-02-12 DIAGNOSIS — K2970 Gastritis, unspecified, without bleeding: Secondary | ICD-10-CM | POA: Diagnosis not present

## 2018-02-12 DIAGNOSIS — Z888 Allergy status to other drugs, medicaments and biological substances status: Secondary | ICD-10-CM | POA: Diagnosis not present

## 2018-02-12 DIAGNOSIS — G309 Alzheimer's disease, unspecified: Secondary | ICD-10-CM | POA: Diagnosis not present

## 2018-02-12 DIAGNOSIS — N390 Urinary tract infection, site not specified: Secondary | ICD-10-CM | POA: Diagnosis not present

## 2018-02-12 DIAGNOSIS — Z7984 Long term (current) use of oral hypoglycemic drugs: Secondary | ICD-10-CM | POA: Diagnosis not present

## 2018-02-12 DIAGNOSIS — D649 Anemia, unspecified: Secondary | ICD-10-CM | POA: Diagnosis not present

## 2018-02-12 DIAGNOSIS — K922 Gastrointestinal hemorrhage, unspecified: Secondary | ICD-10-CM | POA: Diagnosis not present

## 2018-02-12 DIAGNOSIS — R2689 Other abnormalities of gait and mobility: Secondary | ICD-10-CM | POA: Diagnosis not present

## 2018-02-12 DIAGNOSIS — Z8673 Personal history of transient ischemic attack (TIA), and cerebral infarction without residual deficits: Secondary | ICD-10-CM | POA: Diagnosis not present

## 2018-02-12 DIAGNOSIS — R531 Weakness: Secondary | ICD-10-CM | POA: Diagnosis not present

## 2018-02-12 DIAGNOSIS — F0390 Unspecified dementia without behavioral disturbance: Secondary | ICD-10-CM | POA: Diagnosis not present

## 2018-02-12 DIAGNOSIS — E119 Type 2 diabetes mellitus without complications: Secondary | ICD-10-CM | POA: Diagnosis not present

## 2018-02-12 DIAGNOSIS — Z79899 Other long term (current) drug therapy: Secondary | ICD-10-CM | POA: Diagnosis not present

## 2018-02-12 DIAGNOSIS — R4182 Altered mental status, unspecified: Secondary | ICD-10-CM | POA: Diagnosis not present

## 2018-02-12 DIAGNOSIS — F05 Delirium due to known physiological condition: Secondary | ICD-10-CM | POA: Diagnosis not present

## 2018-02-12 DIAGNOSIS — R41841 Cognitive communication deficit: Secondary | ICD-10-CM | POA: Diagnosis not present

## 2018-02-12 DIAGNOSIS — K921 Melena: Secondary | ICD-10-CM | POA: Diagnosis not present

## 2018-02-12 DIAGNOSIS — Z743 Need for continuous supervision: Secondary | ICD-10-CM | POA: Diagnosis not present

## 2018-02-12 DIAGNOSIS — M6281 Muscle weakness (generalized): Secondary | ICD-10-CM | POA: Diagnosis not present

## 2018-02-12 DIAGNOSIS — J309 Allergic rhinitis, unspecified: Secondary | ICD-10-CM | POA: Diagnosis not present

## 2018-02-12 DIAGNOSIS — M810 Age-related osteoporosis without current pathological fracture: Secondary | ICD-10-CM | POA: Diagnosis not present

## 2018-02-12 DIAGNOSIS — J209 Acute bronchitis, unspecified: Secondary | ICD-10-CM | POA: Diagnosis not present

## 2018-02-12 DIAGNOSIS — I10 Essential (primary) hypertension: Secondary | ICD-10-CM | POA: Diagnosis not present

## 2018-02-12 DIAGNOSIS — R296 Repeated falls: Secondary | ICD-10-CM | POA: Diagnosis not present

## 2018-02-12 DIAGNOSIS — Z9181 History of falling: Secondary | ICD-10-CM | POA: Diagnosis not present

## 2018-02-12 DIAGNOSIS — K317 Polyp of stomach and duodenum: Secondary | ICD-10-CM | POA: Diagnosis not present

## 2018-02-14 DIAGNOSIS — R4182 Altered mental status, unspecified: Secondary | ICD-10-CM | POA: Diagnosis not present

## 2018-02-14 DIAGNOSIS — N390 Urinary tract infection, site not specified: Secondary | ICD-10-CM | POA: Diagnosis not present

## 2018-02-14 DIAGNOSIS — F0390 Unspecified dementia without behavioral disturbance: Secondary | ICD-10-CM | POA: Diagnosis not present

## 2018-02-14 DIAGNOSIS — R531 Weakness: Secondary | ICD-10-CM | POA: Diagnosis not present

## 2018-02-26 DIAGNOSIS — R531 Weakness: Secondary | ICD-10-CM | POA: Diagnosis not present

## 2018-02-26 DIAGNOSIS — F0390 Unspecified dementia without behavioral disturbance: Secondary | ICD-10-CM | POA: Diagnosis not present

## 2018-02-28 ENCOUNTER — Observation Stay (HOSPITAL_COMMUNITY)
Admission: EM | Admit: 2018-02-28 | Discharge: 2018-03-01 | Disposition: A | Discharge status: Home / Self Care | Payer: Medicare Other | Attending: Internal Medicine | Admitting: Internal Medicine

## 2018-02-28 ENCOUNTER — Other Ambulatory Visit: Payer: Self-pay

## 2018-02-28 ENCOUNTER — Encounter (HOSPITAL_COMMUNITY): Payer: Self-pay | Admitting: Emergency Medicine

## 2018-02-28 DIAGNOSIS — I10 Essential (primary) hypertension: Secondary | ICD-10-CM | POA: Diagnosis present

## 2018-02-28 DIAGNOSIS — D649 Anemia, unspecified: Secondary | ICD-10-CM | POA: Diagnosis not present

## 2018-02-28 DIAGNOSIS — E119 Type 2 diabetes mellitus without complications: Secondary | ICD-10-CM | POA: Diagnosis not present

## 2018-02-28 DIAGNOSIS — K317 Polyp of stomach and duodenum: Secondary | ICD-10-CM | POA: Diagnosis not present

## 2018-02-28 DIAGNOSIS — K921 Melena: Secondary | ICD-10-CM | POA: Diagnosis not present

## 2018-02-28 DIAGNOSIS — K922 Gastrointestinal hemorrhage, unspecified: Secondary | ICD-10-CM | POA: Diagnosis present

## 2018-02-28 DIAGNOSIS — F0390 Unspecified dementia without behavioral disturbance: Secondary | ICD-10-CM | POA: Diagnosis present

## 2018-02-28 DIAGNOSIS — K2970 Gastritis, unspecified, without bleeding: Secondary | ICD-10-CM | POA: Diagnosis not present

## 2018-02-28 DIAGNOSIS — Z79899 Other long term (current) drug therapy: Secondary | ICD-10-CM | POA: Insufficient documentation

## 2018-02-28 DIAGNOSIS — Z7984 Long term (current) use of oral hypoglycemic drugs: Secondary | ICD-10-CM | POA: Insufficient documentation

## 2018-02-28 LAB — COMPREHENSIVE METABOLIC PANEL
ALBUMIN: 3.3 g/dL — AB (ref 3.5–5.0)
ALT: 46 U/L (ref 14–54)
ANION GAP: 13 (ref 5–15)
AST: 47 U/L — ABNORMAL HIGH (ref 15–41)
Alkaline Phosphatase: 94 U/L (ref 38–126)
BUN: 55 mg/dL — ABNORMAL HIGH (ref 6–20)
CO2: 24 mmol/L (ref 22–32)
Calcium: 9.8 mg/dL (ref 8.9–10.3)
Chloride: 98 mmol/L — ABNORMAL LOW (ref 101–111)
Creatinine, Ser: 0.79 mg/dL (ref 0.44–1.00)
GFR calc Af Amer: >60 mL/min (ref >60)
GFR calc non Af Amer: >60 mL/min (ref >60)
GLUCOSE: 131 mg/dL — AB (ref 65–99)
POTASSIUM: 4.2 mmol/L (ref 3.5–5.1)
SODIUM: 135 mmol/L (ref 135–145)
Total Bilirubin: 1 mg/dL (ref 0.3–1.2)
Total Protein: 6.7 g/dL (ref 6.5–8.1)

## 2018-02-28 LAB — MRSA PCR SCREENING: MRSA by PCR: NEGATIVE

## 2018-02-28 LAB — CBC WITH DIFFERENTIAL/PLATELET
BASOS PCT: 1 %
Basophils Absolute: 0.1 10*3/uL (ref 0.0–0.1)
EOS ABS: 0.1 10*3/uL (ref 0.0–0.7)
Eosinophils Relative: 1 %
HCT: 29.2 % — ABNORMAL LOW (ref 36.0–46.0)
Hemoglobin: 9.4 g/dL — ABNORMAL LOW (ref 12.0–15.0)
Lymphocytes Relative: 30 %
Lymphs Abs: 3.5 10*3/uL (ref 0.7–4.0)
MCH: 31.9 pg (ref 26.0–34.0)
MCHC: 32.2 g/dL (ref 30.0–36.0)
MCV: 99 fL (ref 78.0–100.0)
MONO ABS: 0.8 10*3/uL (ref 0.1–1.0)
MONOS PCT: 7 %
NEUTROS PCT: 61 %
Neutro Abs: 7 10*3/uL (ref 1.7–7.7)
PLATELETS: 330 10*3/uL (ref 150–400)
RBC: 2.95 MIL/uL — ABNORMAL LOW (ref 3.87–5.11)
RDW: 14.7 % (ref 11.5–15.5)
WBC: 11.4 10*3/uL — ABNORMAL HIGH (ref 4.0–10.5)

## 2018-02-28 LAB — POC OCCULT BLOOD, ED: Fecal Occult Bld: POSITIVE — AB

## 2018-02-28 LAB — HEMOGLOBIN: HEMOGLOBIN: 8.7 g/dL — AB (ref 12.0–15.0)

## 2018-02-28 MED ORDER — GLUCERNA SHAKE PO LIQD
237.0000 mL | Freq: Three times a day (TID) | ORAL | Status: DC
  Administered 2018-02-28 – 2018-03-01 (×2): 237 mL via ORAL

## 2018-02-28 MED ORDER — ADULT MULTIVITAMIN W/MINERALS CH
1.0000 | ORAL_TABLET | Freq: Every day | ORAL | Status: DC
  Administered 2018-02-28 – 2018-03-01 (×2): 1 via ORAL
  Filled 2018-02-28 (×2): qty 1

## 2018-02-28 MED ORDER — SODIUM CHLORIDE 0.9% FLUSH
3.0000 mL | Freq: Two times a day (BID) | INTRAVENOUS | Status: DC
  Administered 2018-02-28 – 2018-03-01 (×2): 3 mL via INTRAVENOUS

## 2018-02-28 MED ORDER — VITAMIN D 1000 UNITS PO TABS
1000.0000 [IU] | ORAL_TABLET | Freq: Every day | ORAL | Status: DC
  Administered 2018-03-01: 1000 [IU] via ORAL
  Filled 2018-02-28: qty 1

## 2018-02-28 MED ORDER — SODIUM CHLORIDE 0.9% FLUSH: 3.0000 mL | INTRAVENOUS | Status: DC | PRN

## 2018-02-28 MED ORDER — METOPROLOL SUCCINATE ER 25 MG PO TB24
25.0000 mg | ORAL_TABLET | Freq: Every day | ORAL | Status: DC
  Administered 2018-03-01: 25 mg via ORAL
  Filled 2018-02-28: qty 1

## 2018-02-28 MED ORDER — CHOLECALCIFEROL 10 MCG (400 UNIT) PO TABS
400.0000 [IU] | ORAL_TABLET | Freq: Two times a day (BID) | ORAL | Status: DC
  Filled 2018-02-28 (×6): qty 1

## 2018-02-28 MED ORDER — SODIUM CHLORIDE 0.9 % IV SOLN: 250.0000 mL | INTRAVENOUS | Status: DC | PRN

## 2018-02-28 MED ORDER — CALCIUM CARBONATE ANTACID 500 MG PO CHEW
1.0000 | CHEWABLE_TABLET | Freq: Two times a day (BID) | ORAL | Status: DC
  Administered 2018-02-28 – 2018-03-01 (×2): 200 mg via ORAL
  Filled 2018-02-28 (×2): qty 1

## 2018-02-28 MED ORDER — VITAMIN B-12 1000 MCG PO TABS
1000.0000 ug | ORAL_TABLET | Freq: Every day | ORAL | Status: DC
  Administered 2018-03-01: 1000 ug via ORAL
  Filled 2018-02-28: qty 1

## 2018-02-28 MED ORDER — GLIMEPIRIDE 2 MG PO TABS
2.0000 mg | ORAL_TABLET | Freq: Every morning | ORAL | Status: DC
  Administered 2018-03-01: 2 mg via ORAL
  Filled 2018-02-28 (×4): qty 1

## 2018-02-28 NOTE — Progress Notes (Signed)
Alert and oriented to name and is able to converse and make needs known but forgetful as to why she has been at [facility] and when asked if she has been doing things for herself at [facility] she says she doesn't know.  Daughter is at bedside and will spend the night

## 2018-02-28 NOTE — ED Triage Notes (Signed)
Pt states  " im not sure why I am up here"  Pt denies any pain and unsure of having blood in stools.  VSS. Ambulatory to bed

## 2018-02-28 NOTE — ED Provider Notes (Signed)
[facility] EMERGENCY DEPARTMENT Provider Note   CSN: [id number] Arrival date & time: 02/28/18  1230     History   Chief Complaint Chief Complaint  Patient presents with  . GI Bleeding    HPI Victoria Christian is a 83 y.o. female.  HPI Patient is coming to the emergency department from physical therapy.  Noted to have blood in stool and had one episode of vomiting.  Patient has history of dementia and states she does not remember any blood in her stool.  She is currently asymptomatic.  She denies nausea, abdominal pain.  Frequently eats beets and strawberries.  Had colonoscopy performed last month which was normal. Past Medical History:  Diagnosis Date  . Dementia   . Diabetes mellitus without complication ([place])   . Hypertension     Patient Active Problem List   Diagnosis Date Noted  . GIB (gastrointestinal bleeding) 02/28/2018  . Lactic acidosis 02/07/2018  . Hypoglycemia 02/07/2018  . Syncope 02/06/2018  . Sepsis secondary to UTI ([place]) 02/06/2018  . Hypokalemia 02/06/2018  . Dementia 02/06/2018  . Diabetes mellitus without complication ([place]) 41/32/4401  . Hypertension 02/06/2018    Past Surgical History:  Procedure Laterality Date  . ABDOMINAL HYSTERECTOMY    . BREAST EXCISIONAL BIOPSY Left    1975  . EYE SURGERY       OB History    Gravida  3   Para  2   Term  2   Preterm      AB  1   Living        SAB  1   TAB      Ectopic      Multiple      Live Births               Home Medications    Prior to Admission medications   Medication Sig Start Date End Date Taking? Authorizing Provider  acetaminophen (TYLENOL) 650 MG CR tablet Take 650 mg by mouth every 6 (six) hours as needed for pain.   Yes [provider]  calcium carbonate (TUMS - DOSED IN MG ELEMENTAL CALCIUM) 500 MG chewable tablet Chew 1 tablet by mouth 2 (two) times daily.   Yes [provider]  cetirizine (ZYRTEC) 10 MG tablet Take 10 mg by mouth every evening.    Yes [provider]  cholecalciferol (VITAMIN D) 1000 units tablet Take 1,000 Units by mouth daily.   Yes [provider]  cholecalciferol (VITAMIN D) 400 units TABS tablet Take 400 Units by mouth 2 (two) times daily.   Yes [provider]  Cyanocobalamin (VITAMIN B12) 1000 MCG TBCR Take 1 tablet by mouth daily.   Yes [provider]  glimepiride (AMARYL) 2 MG tablet Take 2 mg by mouth every morning. 12/20/17  Yes [provider]  metFORMIN (GLUCOPHAGE) 500 MG tablet Take 500 mg by mouth daily.    Yes [provider]  metoprolol succinate (TOPROL-XL) 25 MG 24 hr tablet Take 25 mg by mouth daily.   Yes [provider]  Multiple Vitamin (MULTIVITAMIN WITH MINERALS) TABS tablet Take 1 tablet by mouth daily.   Yes [provider]  feeding supplement, GLUCERNA SHAKE, (GLUCERNA SHAKE) LIQD Take 237 mLs by mouth 3 (three) times daily between meals. 02/08/18   Heath Lark D, DO    Family History Family History  Problem Relation Age of Onset  . Congestive Heart Failure Mother   . Dementia Father   . Cancer Other  Social History Social History   Tobacco Use  . Smoking status: Never Smoker  . Smokeless tobacco: Never Used  Substance Use Topics  . Alcohol use: Never    Frequency: Never  . Drug use: Never     Allergies   Patient has no known allergies.   Review of Systems Review of Systems  Constitutional: Negative for chills and fever.  Eyes: Negative for visual disturbance.  Respiratory: Negative for cough and shortness of breath.   Cardiovascular: Negative for chest pain.  Gastrointestinal: Positive for blood in stool and vomiting. Negative for abdominal pain, constipation, diarrhea and nausea.  Genitourinary: Negative for dysuria and flank pain.  Musculoskeletal: Negative for back pain, myalgias and neck pain.  Skin: Negative for rash and wound.  Neurological: Negative for dizziness, weakness,  light-headedness, numbness and headaches.  All other systems reviewed and are negative.    Physical Exam Updated Vital Signs BP (!) 141/63 (BP Location: Left Arm)   Pulse 89   Temp 98 F (36.7 C) (Oral)   Resp 18   Ht 5\' 1"  (1.549 m)   Wt 73.9 kg (163 lb)   SpO2 96%   BMI 30.80 kg/m   Physical Exam  Constitutional: She is oriented to person, place, and time. She appears well-developed and well-nourished. No distress.  HENT:  Head: Normocephalic and atraumatic.  Mouth/Throat: Oropharynx is clear and moist. No oropharyngeal exudate.  Eyes: Pupils are equal, round, and reactive to light. EOM are normal.  Neck: Normal range of motion. Neck supple.  Cardiovascular: Normal rate and regular rhythm. Exam reveals no gallop and no friction rub.  No murmur heard. Pulmonary/Chest: Effort normal and breath sounds normal. No stridor. No respiratory distress. She has no wheezes. She has no rales. She exhibits no tenderness.  Abdominal: Soft. Bowel sounds are normal. There is no tenderness. There is no rebound and no guarding.  Genitourinary: Rectal exam shows guaiac positive stool.  Musculoskeletal: Normal range of motion. She exhibits no edema or tenderness.  Neurological: She is alert and oriented to person, place, and time.  5/5 motor in all extremities.  Sensation fully intact.  Mild confusion  Skin: Skin is warm and dry. Capillary refill takes less than 2 seconds. No rash noted. She is not diaphoretic. No erythema.  Psychiatric: She has a normal mood and affect. Her behavior is normal.  Nursing note and vitals reviewed.    ED Treatments / Results  Labs (all labs ordered are listed, but only abnormal results are displayed) Labs Reviewed  CBC WITH DIFFERENTIAL/PLATELET - Abnormal; Notable for the following components:      Result Value   WBC 11.4 (*)    RBC 2.95 (*)    Hemoglobin 9.4 (*)    HCT 29.2 (*)    All other components within normal limits  COMPREHENSIVE METABOLIC PANEL  - Abnormal; Notable for the following components:   Chloride 98 (*)    Glucose, Bld 131 (*)    BUN 55 (*)    Albumin 3.3 (*)    AST 47 (*)    All other components within normal limits  POC OCCULT BLOOD, ED - Abnormal; Notable for the following components:   Fecal Occult Bld POSITIVE (*)    All other components within normal limits    EKG None  Radiology No results found.  Procedures Procedures (including critical care time)  Medications Ordered in ED Medications - No data to display   Initial Impression / Assessment and Plan / ED Course  I have reviewed the triage vital signs and the nursing notes.  Pertinent labs & imaging results that were available during my care of the patient were reviewed by me and considered in my medical decision making (see chart for details).     Hemoglobin dropped 11.6 to 9.4 overt the course oa 2 weeks.  Discussed with Dr. Laural Golden.  Recommends observation under hospitalist service. Discussed with hospitalist, Dr. Shanon Brow.  Will admit.  Patient remains hemodynamically stable. Final Clinical Impressions(s) / ED Diagnoses   Final diagnoses:  Gastrointestinal hemorrhage, unspecified gastrointestinal hemorrhage type    ED Discharge Orders    None       Julianne Rice, MD 02/28/18 1534

## 2018-02-28 NOTE — H&P (Signed)
History and Physical    Victoria Christian DJS:[id number] DOB: [birth year] DOA: 02/28/2018  PCP: Victoria Chroman, MD  Patient coming from: Home  Chief Complaint: Blood in stool  HPI: Victoria Christian is a 83 y.o. female with medical history significant of dementia, hypertension, diabetes was getting physical therapy today when the physical therapist noted some bright red blood in her stool today and sent her to the emergency department.  Patient denies any blood in her stool.  She denies any abdominal pain.  She denies any melena.  She is oriented to person place and time so unclear whether or not her history is reliable or not.  She reports no weakness.  She reports no complaints at all.  Patient referred for admission for possible GI bleed.  She is heme positive in the ED per rectal exam she was not grossly positive however.  Review of Systems: As per HPI otherwise 10 point review of systems negative.   Past Medical History:  Diagnosis Date  . Dementia   . Diabetes mellitus without complication ([place])   . Hypertension     Past Surgical History:  Procedure Laterality Date  . ABDOMINAL HYSTERECTOMY    . BREAST EXCISIONAL BIOPSY Left    1975  . EYE SURGERY       reports that she has never smoked. She has never used smokeless tobacco. She reports that she does not drink alcohol or use drugs.  No Known Allergies  Family History  Problem Relation Age of Onset  . Congestive Heart Failure Mother   . Dementia Father   . Cancer Other     Prior to Admission medications   Medication Sig Start Date End Date Taking? Authorizing Provider  acetaminophen (TYLENOL) 650 MG CR tablet Take 650 mg by mouth every 6 (six) hours as needed for pain.   Yes [provider]  calcium carbonate (TUMS - DOSED IN MG ELEMENTAL CALCIUM) 500 MG chewable tablet Chew 1 tablet by mouth 2 (two) times daily.   Yes [provider]  cetirizine (ZYRTEC) 10 MG tablet Take 10 mg by mouth every evening.   Yes  [provider]  cholecalciferol (VITAMIN D) 1000 units tablet Take 1,000 Units by mouth daily.   Yes [provider]  cholecalciferol (VITAMIN D) 400 units TABS tablet Take 400 Units by mouth 2 (two) times daily.   Yes [provider]  Cyanocobalamin (VITAMIN B12) 1000 MCG TBCR Take 1 tablet by mouth daily.   Yes [provider]  glimepiride (AMARYL) 2 MG tablet Take 2 mg by mouth every morning. 12/20/17  Yes [provider]  metFORMIN (GLUCOPHAGE) 500 MG tablet Take 500 mg by mouth daily.    Yes [provider]  metoprolol succinate (TOPROL-XL) 25 MG 24 hr tablet Take 25 mg by mouth daily.   Yes [provider]  Multiple Vitamin (MULTIVITAMIN WITH MINERALS) TABS tablet Take 1 tablet by mouth daily.   Yes [provider]  feeding supplement, GLUCERNA SHAKE, (GLUCERNA SHAKE) LIQD Take 237 mLs by mouth 3 (three) times daily between meals. 02/08/18   Heath Lark D, DO    Physical Exam: Vitals:   02/28/18 1430 02/28/18 1500 02/28/18 1530 02/28/18 1600  BP: (!) 108/57 118/64 (!) 125/51 136/70  Pulse:      Resp:      Temp:      TempSrc:      SpO2:      Weight:      Height:  Constitutional: NAD, calm, comfortable Vitals:   02/28/18 1430 02/28/18 1500 02/28/18 1530 02/28/18 1600  BP: (!) 108/57 118/64 (!) 125/51 136/70  Pulse:      Resp:      Temp:      TempSrc:      SpO2:      Weight:      Height:       Eyes: PERRL, lids and conjunctivae normal ENMT: Mucous membranes are moist. Posterior pharynx clear of any exudate or lesions.Normal dentition.  Neck: normal, supple, no masses, no thyromegaly Respiratory: clear to auscultation bilaterally, no wheezing, no crackles. Normal respiratory effort. No accessory muscle use.  Cardiovascular: Regular rate and rhythm, no murmurs / rubs / gallops. No extremity edema. 2+ pedal pulses. No carotid bruits.  Abdomen: no tenderness, no masses palpated. No  hepatosplenomegaly. Bowel sounds positive.  Musculoskeletal: no clubbing / cyanosis. No joint deformity upper and lower extremities. Good ROM, no contractures. Normal muscle tone.  Skin: no rashes, lesions, ulcers. No induration Neurologic: CN 2-12 grossly intact. Sensation intact, DTR normal. Strength 5/5 in all 4.  Psychiatric: Normal judgment and insight. Alert and oriented x 3. Normal mood.    Labs on Admission: I have personally reviewed following labs and imaging studies  CBC: Recent Labs  Lab 02/28/18 1347  WBC 11.4*  NEUTROABS 7.0  HGB 9.4*  HCT 29.2*  MCV 99.0  PLT 235   Basic Metabolic Panel: Recent Labs  Lab 02/28/18 1347  NA 135  K 4.2  CL 98*  CO2 24  GLUCOSE 131*  BUN 55*  CREATININE 0.79  CALCIUM 9.8   GFR: Estimated Creatinine Clearance: 51.5 mL/min (by C-G formula based on SCr of 0.79 mg/dL). Liver Function Tests: Recent Labs  Lab 02/28/18 1347  AST 47*  ALT 46  ALKPHOS 94  BILITOT 1.0  PROT 6.7  ALBUMIN 3.3*   No results for input(s): LIPASE, AMYLASE in the last 168 hours. No results for input(s): AMMONIA in the last 168 hours. Coagulation Profile: No results for input(s): INR, PROTIME in the last 168 hours. Cardiac Enzymes: No results for input(s): CKTOTAL, CKMB, CKMBINDEX, TROPONINI in the last 168 hours. BNP (last 3 results) No results for input(s): PROBNP in the last 8760 hours. HbA1C: No results for input(s): HGBA1C in the last 72 hours. CBG: No results for input(s): GLUCAP in the last 168 hours. Lipid Profile: No results for input(s): CHOL, HDL, LDLCALC, TRIG, CHOLHDL, LDLDIRECT in the last 72 hours. Thyroid Function Tests: No results for input(s): TSH, T4TOTAL, FREET4, T3FREE, THYROIDAB in the last 72 hours. Anemia Panel: No results for input(s): VITAMINB12, FOLATE, FERRITIN, TIBC, IRON, RETICCTPCT in the last 72 hours. Urine analysis:    Component Value Date/Time   COLORURINE YELLOW 02/06/2018 1800   APPEARANCEUR HAZY (A)  02/06/2018 1800   LABSPEC 1.017 02/06/2018 1800   PHURINE 5.0 02/06/2018 1800   GLUCOSEU 50 (A) 02/06/2018 1800   HGBUR SMALL (A) 02/06/2018 1800   BILIRUBINUR NEGATIVE 02/06/2018 1800   KETONESUR 5 (A) 02/06/2018 1800   PROTEINUR NEGATIVE 02/06/2018 1800   NITRITE NEGATIVE 02/06/2018 1800   LEUKOCYTESUR LARGE (A) 02/06/2018 1800   Sepsis Labs: !!!!!!!!!!!!!!!!!!!!!!!!!!!!!!!!!!!!!!!!!!!! @LABRCNTIP (procalcitonin:4,lacticidven:4) )No results found for this or any previous visit (from the past 240 hour(s)).   Radiological Exams on Admission: No results found.  Old chart reviewed  Case assessment EDP  Assessment/Plan 83 year old female with a history of dementia comes in with report of bright red blood per stool today Principal Problem:   GIB (  gastrointestinal bleeding)-patient heme positive.  She did have hemoglobin drop from her last hospitalization in the last month hemoglobin down to 9 from 11.6.  Will monitor closely for any active GI bleeding.  Will repeat hemoglobin later this afternoon.  Will repeat in the morning.  GI is to see patient and arrange for scoping probably as an outpatient.  Unless she is found to actively be bleeding which does not appear to be the case at this time.  Check anemia panel.  Active Problems:   Dementia-stable and seems mild   Diabetes mellitus without complication (HCC)-hold metformin   Hypertension-continue home meds    DVT prophylaxis: SCDs Code Status: Full Family Communication: None Disposition Plan: Per day team Consults called: GI Admission status: Observation   Rashon Rezek A MD [facility]  If 7PM-7AM, please contact night-coverage [email or web address] Password [email or web address]  02/28/2018, 4:22 PM

## 2018-03-01 ENCOUNTER — Encounter (HOSPITAL_COMMUNITY): Payer: Self-pay | Admitting: *Deleted

## 2018-03-01 ENCOUNTER — Encounter (HOSPITAL_COMMUNITY)
Admission: EM | Disposition: A | Discharge status: Home / Self Care | Payer: Self-pay | Source: Home / Self Care | Attending: Emergency Medicine

## 2018-03-01 DIAGNOSIS — K2970 Gastritis, unspecified, without bleeding: Secondary | ICD-10-CM

## 2018-03-01 DIAGNOSIS — K921 Melena: Secondary | ICD-10-CM | POA: Diagnosis not present

## 2018-03-01 DIAGNOSIS — R2689 Other abnormalities of gait and mobility: Secondary | ICD-10-CM | POA: Diagnosis not present

## 2018-03-01 DIAGNOSIS — D62 Acute posthemorrhagic anemia: Secondary | ICD-10-CM | POA: Diagnosis not present

## 2018-03-01 DIAGNOSIS — R296 Repeated falls: Secondary | ICD-10-CM | POA: Diagnosis not present

## 2018-03-01 DIAGNOSIS — F0390 Unspecified dementia without behavioral disturbance: Secondary | ICD-10-CM

## 2018-03-01 DIAGNOSIS — D649 Anemia, unspecified: Secondary | ICD-10-CM | POA: Diagnosis not present

## 2018-03-01 DIAGNOSIS — G309 Alzheimer's disease, unspecified: Secondary | ICD-10-CM | POA: Diagnosis not present

## 2018-03-01 DIAGNOSIS — K317 Polyp of stomach and duodenum: Secondary | ICD-10-CM

## 2018-03-01 DIAGNOSIS — K922 Gastrointestinal hemorrhage, unspecified: Secondary | ICD-10-CM | POA: Diagnosis not present

## 2018-03-01 DIAGNOSIS — D500 Iron deficiency anemia secondary to blood loss (chronic): Secondary | ICD-10-CM

## 2018-03-01 DIAGNOSIS — M6281 Muscle weakness (generalized): Secondary | ICD-10-CM | POA: Diagnosis not present

## 2018-03-01 DIAGNOSIS — Z9181 History of falling: Secondary | ICD-10-CM | POA: Diagnosis not present

## 2018-03-01 DIAGNOSIS — E119 Type 2 diabetes mellitus without complications: Secondary | ICD-10-CM | POA: Diagnosis not present

## 2018-03-01 DIAGNOSIS — I10 Essential (primary) hypertension: Secondary | ICD-10-CM | POA: Diagnosis not present

## 2018-03-01 DIAGNOSIS — K3189 Other diseases of stomach and duodenum: Secondary | ICD-10-CM | POA: Diagnosis not present

## 2018-03-01 HISTORY — PX: ESOPHAGOGASTRODUODENOSCOPY: SHX5428

## 2018-03-01 HISTORY — PX: POLYPECTOMY: SHX5525

## 2018-03-01 LAB — BASIC METABOLIC PANEL
ANION GAP: 12 (ref 5–15)
BUN: 47 mg/dL — AB (ref 6–20)
CO2: 24 mmol/L (ref 22–32)
Calcium: 9.4 mg/dL (ref 8.9–10.3)
Chloride: 102 mmol/L (ref 101–111)
Creatinine, Ser: 0.74 mg/dL (ref 0.44–1.00)
GFR calc Af Amer: >60 mL/min (ref >60)
GFR calc non Af Amer: >60 mL/min (ref >60)
GLUCOSE: 148 mg/dL — AB (ref 65–99)
POTASSIUM: 4 mmol/L (ref 3.5–5.1)
Sodium: 138 mmol/L (ref 135–145)

## 2018-03-01 LAB — CBC
HEMATOCRIT: 25.3 % — AB (ref 36.0–46.0)
HEMOGLOBIN: 8.1 g/dL — AB (ref 12.0–15.0)
MCH: 31.5 pg (ref 26.0–34.0)
MCHC: 32 g/dL (ref 30.0–36.0)
MCV: 98.4 fL (ref 78.0–100.0)
Platelets: 289 10*3/uL (ref 150–400)
RBC: 2.57 MIL/uL — ABNORMAL LOW (ref 3.87–5.11)
RDW: 15.1 % (ref 11.5–15.5)
WBC: 9.9 10*3/uL (ref 4.0–10.5)

## 2018-03-01 LAB — PREPARE RBC (CROSSMATCH)

## 2018-03-01 LAB — ABO/RH: ABO/RH(D): O POS

## 2018-03-01 LAB — GLUCOSE, CAPILLARY: GLUCOSE-CAPILLARY: 181 mg/dL — AB (ref 65–99)

## 2018-03-01 SURGERY — EGD (ESOPHAGOGASTRODUODENOSCOPY)
Anesthesia: Moderate Sedation

## 2018-03-01 MED ORDER — LIDOCAINE VISCOUS 2 % MT SOLN
OROMUCOSAL | Status: DC | PRN
  Administered 2018-03-01: 1 via OROMUCOSAL

## 2018-03-01 MED ORDER — MIDAZOLAM HCL 5 MG/5ML IJ SOLN
INTRAMUSCULAR | Status: AC
  Filled 2018-03-01: qty 10

## 2018-03-01 MED ORDER — MEPERIDINE HCL 100 MG/ML IJ SOLN
INTRAMUSCULAR | Status: AC
  Filled 2018-03-01: qty 2

## 2018-03-01 MED ORDER — LIDOCAINE VISCOUS 2 % MT SOLN
OROMUCOSAL | Status: AC
  Filled 2018-03-01: qty 15

## 2018-03-01 MED ORDER — STERILE WATER FOR IRRIGATION IR SOLN
Status: DC | PRN
  Administered 2018-03-01: 15 mL

## 2018-03-01 MED ORDER — DEXTROSE-NACL 5-0.9 % IV SOLN
INTRAVENOUS | Status: DC
  Administered 2018-03-01: 12:00:00 via INTRAVENOUS

## 2018-03-01 MED ORDER — SODIUM CHLORIDE 0.9% FLUSH
INTRAVENOUS | Status: AC
  Filled 2018-03-01: qty 10

## 2018-03-01 MED ORDER — MIDAZOLAM HCL 5 MG/5ML IJ SOLN
INTRAMUSCULAR | Status: DC | PRN
  Administered 2018-03-01 (×2): 1 mg via INTRAVENOUS
  Administered 2018-03-01: 2 mg via INTRAVENOUS

## 2018-03-01 MED ORDER — FERROUS SULFATE 325 (65 FE) MG PO TABS: 325.0000 mg | ORAL_TABLET | Freq: Two times a day (BID) | ORAL | 3 refills | Status: AC

## 2018-03-01 MED ORDER — MEPERIDINE HCL 100 MG/ML IJ SOLN
INTRAMUSCULAR | Status: DC | PRN
  Administered 2018-03-01 (×2): 25 mg via INTRAVENOUS

## 2018-03-01 NOTE — Discharge Instructions (Signed)
HAVE CBC AND BMP IN ONE WEEK

## 2018-03-01 NOTE — Progress Notes (Signed)
Report called to [facility] at [facility].  Daughter to drive patient there.

## 2018-03-01 NOTE — Consult Note (Addendum)
Referring Provider: No ref. provider found Primary Care Physician:  Glenda Chroman, MD Primary Gastroenterologist:  Barney Drain  Reason for Consultation:  ANEMIA   Impression: ADMITTED WITH ANEMIA/?? MELENA AND HAD COLONOSCOPY 4/14. DIFFERENTIAL DIAGNOSIS INCLUDES: PUD, AVMs, DIEULAFOY'S LESION, LESS LIKELY GE JUNCTION TUMOR, GASTRIC CA.   Plan: 1. EGD TODAY @1230 . CONSIDER GIVENS AS AN OUTPT IF NO SOURCE FOR ANEMIA FOUND. DISCUSSED PROCEDURE, BENEFITS, & RISKS: < 1% chance of medication reaction, bleeding, OR perforation. 2. PROTONIX DAILY. 3. NPO except sips with meds 4. 1u pRBC TODAY.    HPI:  WAS ADMITTED TO [facility] FOR LOW BP. NO RECTAL BLEEDING AND DISCHARGED HOME. FELL AND WAS SEN AT [facility] FOR RECTAL BLEEDING. WENT ON FOR 24 HRS. THEN HAD COLONOSCOPY APR 14 AND NO ACUTE FINDINGS. D/C TO REHAB 4/17 WITH Hb 10.7. WAS PARTICIPATING IN REHAB YESTERDAY AND BLOOD WAS REPORTED IN HER STOOL. IN THE ER SHE HAD DARK STOOL. NO RECENT BRBPR. HAD ONE BM TODAY THAT WAS BLACK AND FORMED. HB DOWN FROM 10.7 TO 8.1. HAD VOMITED AT NH BUT NO BLOOD. BMs: NO CONSTIPATION  PT DENIES FEVER, CHILLS, HEMATEMESIS, nausea, melena, diarrhea, CHEST PAIN, SHORTNESS OF BREATH,  CHANGE IN BOWEL IN HABITS, constipation, abdominal pain, problems swallowing, problems with sedation, OR heartburn or indigestion.    Past Medical History:  Diagnosis Date  . Dementia   . Diabetes mellitus without complication ([place])   . Hypertension     Past Surgical History:  Procedure Laterality Date  . ABDOMINAL HYSTERECTOMY    . BREAST EXCISIONAL BIOPSY Left    1975  . EYE SURGERY      Prior to Admission medications   Medication Sig Start Date End Date Taking? Authorizing Provider  acetaminophen (TYLENOL) 650 MG CR tablet Take 650 mg by mouth every 6 (six) hours as needed for pain.   Yes [provider]  calcium carbonate (TUMS - DOSED IN MG ELEMENTAL CALCIUM) 500 MG chewable tablet Chew 1 tablet by mouth 2  (two) times daily.   Yes [provider]  cetirizine (ZYRTEC) 10 MG tablet Take 10 mg by mouth every evening.   Yes [provider]  cholecalciferol (VITAMIN D) 1000 units tablet Take 1,000 Units by mouth daily.   Yes [provider]  Cyanocobalamin (VITAMIN B12) 1000 MCG TBCR Take 1 tablet by mouth daily.   Yes [provider]  glimepiride (AMARYL) 2 MG tablet Take 2 mg by mouth every morning. 12/20/17  Yes [provider]  metFORMIN (GLUCOPHAGE) 500 MG tablet Take 500 mg by mouth daily.    Yes [provider]  metoprolol succinate (TOPROL-XL) 25 MG 24 hr tablet Take 25 mg by mouth daily.   Yes [provider]  Multiple Vitamin (MULTIVITAMIN WITH MINERALS) TABS tablet Take 1 tablet by mouth daily.   Yes [provider]  feeding supplement, GLUCERNA SHAKE, (GLUCERNA SHAKE) LIQD Take 237 mLs by mouth 3 (three) times daily between meals. 02/08/18   Heath Lark D, DO    Current Facility-Administered Medications  Medication Dose Route Frequency Provider Last Rate Last Dose  . 0.9 %  sodium chloride infusion  250 mL Intravenous PRN Phillips Grout, MD      . calcium carbonate (TUMS - dosed in mg elemental calcium) chewable tablet 200 mg of elemental calcium  1 tablet Oral BID Derrill Kay A, MD   200 mg of elemental calcium at 02/28/18 2120  . cholecalciferol (VITAMIN D) tablet 1,000 Units  1,000 Units  Oral Daily Derrill Kay A, MD      . feeding supplement (GLUCERNA SHAKE) (GLUCERNA SHAKE) liquid 237 mL  237 mL Oral TID BM Phillips Grout, MD   237 mL at 02/28/18 2121  . glimepiride (AMARYL) tablet 2 mg  2 mg Oral q morning - 10a Derrill Kay A, MD      . metoprolol succinate (TOPROL-XL) 24 hr tablet 25 mg  25 mg Oral Daily Derrill Kay A, MD      . multivitamin with minerals tablet 1 tablet  1 tablet Oral Daily Phillips Grout, MD   1 tablet at 02/28/18 1739  . sodium chloride flush (NS) 0.9 % injection 3 mL  3 mL Intravenous  Q12H Derrill Kay A, MD   3 mL at 02/28/18 2122  . sodium chloride flush (NS) 0.9 % injection 3 mL  3 mL Intravenous PRN Phillips Grout, MD      . vitamin B-12 (CYANOCOBALAMIN) tablet 1,000 mcg  1,000 mcg Oral Daily Phillips Grout, MD        Allergies as of 02/28/2018  . (No Known Allergies)    Family History  Problem Relation Age of Onset  . Congestive Heart Failure Mother   . Dementia Father   . Cancer Other      Social History   Socioeconomic History  . Marital status: Married    Spouse name: Not on file  . Number of children: Not on file  . Years of education: Not on file  . Highest education level: Not on file  Occupational History  . Not on file  Social Needs  . Financial resource strain: Not on file  . Food insecurity:    Worry: Not on file    Inability: Not on file  . Transportation needs:    Medical: Not on file    Non-medical: Not on file  Tobacco Use  . Smoking status: Never Smoker  . Smokeless tobacco: Never Used  Substance and Sexual Activity  . Alcohol use: Never    Frequency: Never  . Drug use: Never  . Sexual activity: Not on file  Lifestyle  . Physical activity:    Days per week: Not on file    Minutes per session: Not on file  . Stress: Not on file  Relationships  . Social connections:    Talks on phone: Not on file    Gets together: Not on file    Attends religious service: Not on file    Active member of club or organization: Not on file    Attends meetings of clubs or organizations: Not on file    Relationship status: Not on file  . Intimate partner violence:    Fear of current or ex partner: Not on file    Emotionally abused: Not on file    Physically abused: Not on file    Forced sexual activity: Not on file  Other Topics Concern  . Not on file  Social History Narrative  . Not on file    Review of Systems: PER HPI OTHERWISE ALL SYSTEMS ARE NEGATIVE.   Vitals: Blood pressure 122/61, pulse 74, temperature 98.5 F (36.9  C), temperature source Oral, resp. rate 18, height 5\' 1"  (1.549 m), weight 157 lb 14.4 oz (71.6 kg), SpO2 96 %.  Physical Exam: General:   Alert,  Well-developed, well-nourished, pleasant and cooperative in NAD Head:  Normocephalic and atraumatic. Eyes:  Sclera clear, no icterus.   Conjunctiva pink. Mouth:  No  lesions, dentition normal. Neck:  Supple; no masses. Lungs:  Clear throughout to auscultation.   No wheezes. No acute distress. Heart:  Regular rate and rhythm; no murmurs Abdomen:  Soft, nontender and nondistended. No masses noted. Normal bowel sounds, without guarding, and without rebound.   Msk:  Symmetrical without gross deformities. Normal posture. Extremities:  Without edema. Neurologic:  Alert and  interactive; Strabismus. NO  NEW FOCAL DEFICITS.  Cervical Nodes:  No significant cervical adenopathy. Psych:  Alert and cooperative. Normal mood and affect.    Lab Results: Recent Labs    02/28/18 1347 02/28/18 1857 03/01/18 0601  WBC 11.4*  --  9.9  HGB 9.4* 8.7* 8.1*  HCT 29.2*  --  25.3*  PLT 330  --  289   BMET Recent Labs    02/28/18 1347 03/01/18 0601  NA 135 138  K 4.2 4.0  CL 98* 102  CO2 24 24  GLUCOSE 131* 148*  BUN 55* 47*  CREATININE 0.79 0.74  CALCIUM 9.8 9.4   LFT Recent Labs    02/28/18 1347  PROT 6.7  ALBUMIN 3.3*  AST 47*  ALT 46  ALKPHOS 94  BILITOT 1.0     Studies/Results: none   LOS: 0 days   Talma Aguillard  03/01/2018, 8:07 AM

## 2018-03-01 NOTE — Interval H&P Note (Signed)
History and Physical Interval Note:  03/01/2018 12:36 PM  Victoria Christian  has presented today for surgery, with the diagnosis of MELENA  The various methods of treatment have been discussed with the patient and family. After consideration of risks, benefits and other options for treatment, the patient has consented to  Procedure(s): ESOPHAGOGASTRODUODENOSCOPY (EGD) (N/A) as a surgical intervention .  The patient's history has been reviewed, patient examined, no change in status, stable for surgery.  I have reviewed the patient's chart and labs.  Questions were answered to the patient's satisfaction.     [employer]

## 2018-03-01 NOTE — H&P (View-Only) (Signed)
Referring Provider: No ref. provider found Primary Care Physician:  Glenda Chroman, MD Primary Gastroenterologist:  Barney Drain  Reason for Consultation:  ANEMIA   Impression: ADMITTED WITH ANEMIA/?? MELENA AND HAD COLONOSCOPY 4/14. DIFFERENTIAL DIAGNOSIS INCLUDES: PUD, AVMs, DIEULAFOY'S LESION, LESS LIKELY GE JUNCTION TUMOR, GASTRIC CA.   Plan: 1. EGD TODAY @1230 . CONSIDER GIVENS AS AN OUTPT IF NO SOURCE FOR ANEMIA FOUND. DISCUSSED PROCEDURE, BENEFITS, & RISKS: < 1% chance of medication reaction, bleeding, OR perforation. 2. PROTONIX DAILY. 3. NPO except sips with meds 4. 1u pRBC TODAY.    HPI:  WAS ADMITTED TO [facility] FOR LOW BP. NO RECTAL BLEEDING AND DISCHARGED HOME. FELL AND WAS SEN AT [facility] FOR RECTAL BLEEDING. WENT ON FOR 24 HRS. THEN HAD COLONOSCOPY APR 14 AND NO ACUTE FINDINGS. D/C TO REHAB 4/17 WITH Hb 10.7. WAS PARTICIPATING IN REHAB YESTERDAY AND BLOOD WAS REPORTED IN HER STOOL. IN THE ER SHE HAD DARK STOOL. NO RECENT BRBPR. HAD ONE BM TODAY THAT WAS BLACK AND FORMED. HB DOWN FROM 10.7 TO 8.1. HAD VOMITED AT NH BUT NO BLOOD. BMs: NO CONSTIPATION  PT DENIES FEVER, CHILLS, HEMATEMESIS, nausea, melena, diarrhea, CHEST PAIN, SHORTNESS OF BREATH,  CHANGE IN BOWEL IN HABITS, constipation, abdominal pain, problems swallowing, problems with sedation, OR heartburn or indigestion.    Past Medical History:  Diagnosis Date  . Dementia   . Diabetes mellitus without complication ([place])   . Hypertension     Past Surgical History:  Procedure Laterality Date  . ABDOMINAL HYSTERECTOMY    . BREAST EXCISIONAL BIOPSY Left    1975  . EYE SURGERY      Prior to Admission medications   Medication Sig Start Date End Date Taking? Authorizing Provider  acetaminophen (TYLENOL) 650 MG CR tablet Take 650 mg by mouth every 6 (six) hours as needed for pain.   Yes [provider]  calcium carbonate (TUMS - DOSED IN MG ELEMENTAL CALCIUM) 500 MG chewable tablet Chew 1 tablet by mouth 2  (two) times daily.   Yes [provider]  cetirizine (ZYRTEC) 10 MG tablet Take 10 mg by mouth every evening.   Yes [provider]  cholecalciferol (VITAMIN D) 1000 units tablet Take 1,000 Units by mouth daily.   Yes [provider]  Cyanocobalamin (VITAMIN B12) 1000 MCG TBCR Take 1 tablet by mouth daily.   Yes [provider]  glimepiride (AMARYL) 2 MG tablet Take 2 mg by mouth every morning. 12/20/17  Yes [provider]  metFORMIN (GLUCOPHAGE) 500 MG tablet Take 500 mg by mouth daily.    Yes [provider]  metoprolol succinate (TOPROL-XL) 25 MG 24 hr tablet Take 25 mg by mouth daily.   Yes [provider]  Multiple Vitamin (MULTIVITAMIN WITH MINERALS) TABS tablet Take 1 tablet by mouth daily.   Yes [provider]  feeding supplement, GLUCERNA SHAKE, (GLUCERNA SHAKE) LIQD Take 237 mLs by mouth 3 (three) times daily between meals. 02/08/18   Heath Lark D, DO    Current Facility-Administered Medications  Medication Dose Route Frequency Provider Last Rate Last Dose  . 0.9 %  sodium chloride infusion  250 mL Intravenous PRN Phillips Grout, MD      . calcium carbonate (TUMS - dosed in mg elemental calcium) chewable tablet 200 mg of elemental calcium  1 tablet Oral BID Derrill Kay A, MD   200 mg of elemental calcium at 02/28/18 2120  . cholecalciferol (VITAMIN D) tablet 1,000 Units  1,000 Units  Oral Daily Derrill Kay A, MD      . feeding supplement (GLUCERNA SHAKE) (GLUCERNA SHAKE) liquid 237 mL  237 mL Oral TID BM Phillips Grout, MD   237 mL at 02/28/18 2121  . glimepiride (AMARYL) tablet 2 mg  2 mg Oral q morning - 10a Derrill Kay A, MD      . metoprolol succinate (TOPROL-XL) 24 hr tablet 25 mg  25 mg Oral Daily Derrill Kay A, MD      . multivitamin with minerals tablet 1 tablet  1 tablet Oral Daily Phillips Grout, MD   1 tablet at 02/28/18 1739  . sodium chloride flush (NS) 0.9 % injection 3 mL  3 mL Intravenous  Q12H Derrill Kay A, MD   3 mL at 02/28/18 2122  . sodium chloride flush (NS) 0.9 % injection 3 mL  3 mL Intravenous PRN Phillips Grout, MD      . vitamin B-12 (CYANOCOBALAMIN) tablet 1,000 mcg  1,000 mcg Oral Daily Phillips Grout, MD        Allergies as of 02/28/2018  . (No Known Allergies)    Family History  Problem Relation Age of Onset  . Congestive Heart Failure Mother   . Dementia Father   . Cancer Other      Social History   Socioeconomic History  . Marital status: Married    Spouse name: Not on file  . Number of children: Not on file  . Years of education: Not on file  . Highest education level: Not on file  Occupational History  . Not on file  Social Needs  . Financial resource strain: Not on file  . Food insecurity:    Worry: Not on file    Inability: Not on file  . Transportation needs:    Medical: Not on file    Non-medical: Not on file  Tobacco Use  . Smoking status: Never Smoker  . Smokeless tobacco: Never Used  Substance and Sexual Activity  . Alcohol use: Never    Frequency: Never  . Drug use: Never  . Sexual activity: Not on file  Lifestyle  . Physical activity:    Days per week: Not on file    Minutes per session: Not on file  . Stress: Not on file  Relationships  . Social connections:    Talks on phone: Not on file    Gets together: Not on file    Attends religious service: Not on file    Active member of club or organization: Not on file    Attends meetings of clubs or organizations: Not on file    Relationship status: Not on file  . Intimate partner violence:    Fear of current or ex partner: Not on file    Emotionally abused: Not on file    Physically abused: Not on file    Forced sexual activity: Not on file  Other Topics Concern  . Not on file  Social History Narrative  . Not on file    Review of Systems: PER HPI OTHERWISE ALL SYSTEMS ARE NEGATIVE.   Vitals: Blood pressure 122/61, pulse 74, temperature 98.5 F (36.9  C), temperature source Oral, resp. rate 18, height 5\' 1"  (1.549 m), weight 157 lb 14.4 oz (71.6 kg), SpO2 96 %.  Physical Exam: General:   Alert,  Well-developed, well-nourished, pleasant and cooperative in NAD Head:  Normocephalic and atraumatic. Eyes:  Sclera clear, no icterus.   Conjunctiva pink. Mouth:  No  lesions, dentition normal. Neck:  Supple; no masses. Lungs:  Clear throughout to auscultation.   No wheezes. No acute distress. Heart:  Regular rate and rhythm; no murmurs Abdomen:  Soft, nontender and nondistended. No masses noted. Normal bowel sounds, without guarding, and without rebound.   Msk:  Symmetrical without gross deformities. Normal posture. Extremities:  Without edema. Neurologic:  Alert and  interactive; Strabismus. NO  NEW FOCAL DEFICITS.  Cervical Nodes:  No significant cervical adenopathy. Psych:  Alert and cooperative. Normal mood and affect.    Lab Results: Recent Labs    02/28/18 1347 02/28/18 1857 03/01/18 0601  WBC 11.4*  --  9.9  HGB 9.4* 8.7* 8.1*  HCT 29.2*  --  25.3*  PLT 330  --  289   BMET Recent Labs    02/28/18 1347 03/01/18 0601  NA 135 138  K 4.2 4.0  CL 98* 102  CO2 24 24  GLUCOSE 131* 148*  BUN 55* 47*  CREATININE 0.79 0.74  CALCIUM 9.8 9.4   LFT Recent Labs    02/28/18 1347  PROT 6.7  ALBUMIN 3.3*  AST 47*  ALT 46  ALKPHOS 94  BILITOT 1.0     Studies/Results: none   LOS: 0 days   Rosha Cocker  03/01/2018, 8:07 AM

## 2018-03-01 NOTE — Discharge Summary (Signed)
Physician Discharge Summary  Hollin Crewe MCN:[id number] DOB: 01/31/1935 DOA: 02/28/2018  PCP: Glenda Chroman, MD  Admit date: 02/28/2018 Discharge date: 03/01/2018  Admitted From: Skilled nursing facility Disposition: Skilled nursing facility  Recommendations for Outpatient Follow-up:  1. Follow up with PCP in 1-2 weeks 2. Please obtain BMP/CBC in one week 3. Follow-up with Dr. Oneida Alar in 3 months   Discharge Condition: Stable CODE STATUS: Full code Diet recommendation: Heart Healthy / Carb Modified   Brief/Interim Summary: 83 year old female admitted to the hospital with dark stool and dropping hemoglobin.  Seen by gastroenterology and underwent EGD that did not show any obvious source of bleeding.  She recently had colonoscopy performed on 4/14.  Patient was transfused 1 unit of PRBCs during her hospital stay.  Hemodynamically, she is remained stable.  She was noted to have a single gastric polyp on EGD which was resected.  This is been sent for biopsy.  If no further source of anemia is identified, she will need Givens capsule study.  She is been advised to follow-up with GI in the next 3 months.  The remainder of her medical problems have remained stable.  She has been cleared for discharge by GI.  She will plan to return to skilled nursing facility.  Discharge Diagnoses:  Principal Problem:   GIB (gastrointestinal bleeding) Active Problems:   Dementia   Diabetes mellitus without complication ([place])   Hypertension    Discharge Instructions  Discharge Instructions    Diet - low sodium heart healthy   Complete by:  As directed    Increase activity slowly   Complete by:  As directed      Allergies as of 03/01/2018   No Known Allergies     Medication List    TAKE these medications   acetaminophen 650 MG CR tablet Commonly known as:  TYLENOL Take 650 mg by mouth every 6 (six) hours as needed for pain.   calcium carbonate 500 MG chewable tablet Commonly known as:  TUMS - dosed  in mg elemental calcium Chew 1 tablet by mouth 2 (two) times daily.   cetirizine 10 MG tablet Commonly known as:  ZYRTEC Take 10 mg by mouth every evening.   cholecalciferol 1000 units tablet Commonly known as:  VITAMIN D Take 1,000 Units by mouth daily.   feeding supplement (GLUCERNA SHAKE) Liqd Take 237 mLs by mouth 3 (three) times daily between meals.   ferrous sulfate 325 (65 FE) MG tablet Take 1 tablet (325 mg total) by mouth 2 (two) times daily with a meal.   glimepiride 2 MG tablet Commonly known as:  AMARYL Take 2 mg by mouth every morning.   metFORMIN 500 MG tablet Commonly known as:  GLUCOPHAGE Take 500 mg by mouth daily.   metoprolol succinate 25 MG 24 hr tablet Commonly known as:  TOPROL-XL Take 25 mg by mouth daily.   multivitamin with minerals Tabs tablet Take 1 tablet by mouth daily.   Vitamin B12 1000 MCG Tbcr Take 1 tablet by mouth daily.       No Known Allergies  Consultations:  Gastroenterology   Procedures/Studies: Dg Chest 2 View  Result Date: 02/06/2018 CLINICAL DATA:  Confusion and weakness for 1 month EXAM: CHEST - 2 VIEW COMPARISON:  01/28/2018 FINDINGS: No acute airspace disease. No pleural effusion. Normal heart size with aortic atherosclerosis. No pneumothorax. Degenerative changes of the spine. IMPRESSION: No active cardiopulmonary disease. Electronically Signed   By: Donavan Foil M.D.   On: 02/06/2018 16:16  Ct Head Wo Contrast  Result Date: 02/06/2018 CLINICAL DATA:  Weakness for a month.  Hypotension.  Confusion. EXAM: CT HEAD WITHOUT CONTRAST TECHNIQUE: Contiguous axial images were obtained from the base of the skull through the vertex without intravenous contrast. COMPARISON:  October 18, 2017 FINDINGS: Brain: No subdural, epidural, or subarachnoid hemorrhage. Cerebellum, brainstem, and basal cisterns are normal. Ventricles and sulci are stable. [place]sass matter changes are stable. No acute cortical ischemia or infarct. No mass  effect or midline shift. Prominent perivascular spaces versus lacunar infarcts in the left inferior basal ganglia. These are favored to represent prominent perivascular spaces. Vascular: Calcified atherosclerosis in the intracranial carotids. Skull: Normal. Negative for fracture or focal lesion. Sinuses/Orbits: No acute finding. Other: None. IMPRESSION: No acute intracranial abnormalities to explain the patient's symptoms. Electronically Signed   By: Dorise Bullion III M.D   On: 02/06/2018 18:06   US Carotid Bilateral  Result Date: 02/07/2018 CLINICAL DATA:  Syncope and weakness. EXAM: BILATERAL CAROTID DUPLEX ULTRASOUND TECHNIQUE: Pearline Cables scale imaging, color Doppler and duplex ultrasound were performed of bilateral carotid and vertebral arteries in the neck. COMPARISON:  None. FINDINGS: Criteria: Quantification of carotid stenosis is based on velocity parameters that correlate the residual internal carotid diameter with NASCET-based stenosis levels, using the diameter of the distal internal carotid lumen as the denominator for stenosis measurement. The following velocity measurements were obtained: RIGHT ICA:  68 cm/sec CCA:  95 cm/sec SYSTOLIC ICA/CCA RATIO:  0.7 DIASTOLIC ICA/CCA RATIO:  1.1 ECA:  98 cm/sec LEFT ICA:  66 cm/sec CCA:  85 cm/sec SYSTOLIC ICA/CCA RATIO:  0.8 DIASTOLIC ICA/CCA RATIO:  1.6 ECA:  79 cm/sec RIGHT CAROTID ARTERY: Minimal plaque or intimal thickening in the internal carotid artery. Normal waveforms and velocities in the internal carotid artery. External carotid artery is patent with normal waveform. RIGHT VERTEBRAL ARTERY: Antegrade flow and normal waveform in the right vertebral artery. LEFT CAROTID ARTERY: Minimal plaque or intimal thickening at the left carotid bulb. External carotid artery is patent with normal waveform. Normal waveforms and velocities in the internal carotid artery. LEFT VERTEBRAL ARTERY: Antegrade flow and normal waveform in the left vertebral artery. IMPRESSION:  Minimal atherosclerotic disease identified in the carotid arteries. No significant carotid artery stenosis. Patent vertebral arteries with antegrade flow. Electronically Signed   By: Markus Daft M.D.   On: 02/07/2018 14:42   EGD:- A single gastric polyp. Resected and retrieved.                           - MILD Gastritis. Biopsied.                           - NO HIGH RISK LESION OR OBVIOUS SOURCE FOR ANEMIA                            IDENTIFIED.     Subjective: No abdominal pain, no nausea or vomiting.  Discharge Exam: Vitals:   03/01/18 1444 03/01/18 1700  BP: (!) 119/57 129/61  Pulse:  79  Resp: 16 16  Temp: 97.8 F (36.6 C) 97.8 F (36.6 C)  SpO2: 98% 100%   Vitals:   03/01/18 1335 03/01/18 1414 03/01/18 1444 03/01/18 1700  BP: (!) 107/49 (!) 119/49 (!) 119/57 129/61  Pulse: 68 66  79  Resp: 11 18 16 16   Temp:  97.9 F (36.6 C) 97.8 F (  36.6 C) 97.8 F (36.6 C)  TempSrc:   Oral Oral  SpO2: 100% 96% 98% 100%  Weight:      Height:        General: Pt is alert, awake, not in acute distress Cardiovascular: RRR, S1/S2 +, no rubs, no gallops Respiratory: CTA bilaterally, no wheezing, no rhonchi Abdominal: Soft, NT, ND, bowel sounds + Extremities: no edema, no cyanosis    The results of significant diagnostics from this hospitalization (including imaging, microbiology, ancillary and laboratory) are listed below for reference.     Microbiology: Recent Results (from the past 240 hour(s))  MRSA PCR Screening     Status: None   Collection Time: 02/28/18  5:30 PM  Result Value Ref Range Status   MRSA by PCR NEGATIVE NEGATIVE Final    Comment:        The GeneXpert MRSA Assay (FDA approved for NASAL specimens only), is one component of a comprehensive MRSA colonization surveillance program. It is not intended to diagnose MRSA infection nor to guide or monitor treatment for MRSA infections. Performed at [facility], [address], [place], [place] [address]       Labs: BNP (last 3 results) No results for input(s): BNP in the last 8760 hours. Basic Metabolic Panel: Recent Labs  Lab 02/28/18 1347 03/01/18 0601  NA 135 138  K 4.2 4.0  CL 98* 102  CO2 24 24  GLUCOSE 131* 148*  BUN 55* 47*  CREATININE 0.79 0.74  CALCIUM 9.8 9.4   Liver Function Tests: Recent Labs  Lab 02/28/18 1347  AST 47*  ALT 46  ALKPHOS 94  BILITOT 1.0  PROT 6.7  ALBUMIN 3.3*   No results for input(s): LIPASE, AMYLASE in the last 168 hours. No results for input(s): AMMONIA in the last 168 hours. CBC: Recent Labs  Lab 02/28/18 1347 02/28/18 1857 03/01/18 0601  WBC 11.4*  --  9.9  NEUTROABS 7.0  --   --   HGB 9.4* 8.7* 8.1*  HCT 29.2*  --  25.3*  MCV 99.0  --  98.4  PLT 330  --  289   Cardiac Enzymes: No results for input(s): CKTOTAL, CKMB, CKMBINDEX, TROPONINI in the last 168 hours. BNP: Invalid input(s): POCBNP CBG: Recent Labs  Lab 03/01/18 1232  GLUCAP 181*   D-Dimer No results for input(s): DDIMER in the last 72 hours. Hgb A1c No results for input(s): HGBA1C in the last 72 hours. Lipid Profile No results for input(s): CHOL, HDL, LDLCALC, TRIG, CHOLHDL, LDLDIRECT in the last 72 hours. Thyroid function studies No results for input(s): TSH, T4TOTAL, T3FREE, THYROIDAB in the last 72 hours.  Invalid input(s): FREET3 Anemia work up No results for input(s): VITAMINB12, FOLATE, FERRITIN, TIBC, IRON, RETICCTPCT in the last 72 hours. Urinalysis    Component Value Date/Time   COLORURINE YELLOW 02/06/2018 1800   APPEARANCEUR HAZY (A) 02/06/2018 1800   LABSPEC 1.017 02/06/2018 1800   PHURINE 5.0 02/06/2018 1800   GLUCOSEU 50 (A) 02/06/2018 1800   HGBUR SMALL (A) 02/06/2018 1800   BILIRUBINUR NEGATIVE 02/06/2018 1800   KETONESUR 5 (A) 02/06/2018 1800   PROTEINUR NEGATIVE 02/06/2018 1800   NITRITE NEGATIVE 02/06/2018 1800   LEUKOCYTESUR LARGE (A) 02/06/2018 1800   Sepsis Labs Invalid input(s): PROCALCITONIN,  WBC,   LACTICIDVEN Microbiology Recent Results (from the past 240 hour(s))  MRSA PCR Screening     Status: None   Collection Time: 02/28/18  5:30 PM  Result Value Ref Range Status   MRSA by  PCR NEGATIVE NEGATIVE Final    Comment:        The GeneXpert MRSA Assay (FDA approved for NASAL specimens only), is one component of a comprehensive MRSA colonization surveillance program. It is not intended to diagnose MRSA infection nor to guide or monitor treatment for MRSA infections. Performed at [facility], [address], [place], [place] [address]      Time coordinating discharge: 68mins  SIGNED:   Kathie Dike, MD  [facility] 03/01/2018, 6:27 PM Pager   If 7PM-7AM, please contact night-coverage [email or web address] Password [email or web address]

## 2018-03-01 NOTE — Progress Notes (Signed)
Received one unit prbc with no adverse reaction.  Daughter has been at  Bedside today

## 2018-03-01 NOTE — Op Note (Signed)
[facility] Patient Name: Victoria Christian Procedure Date: 03/01/2018 12:09 PM MRN: [id number] Date of Birth: 07-01-1935 Attending MD: Barney Drain MD, MD CSN: [id number] Age: 83 Admit Type: Inpatient Procedure:                Upper GI endoscopy WITH COLD FORCEPS BIOPSY Indications:              Anemia-FORMED BLACK STOOL THIS AM. TAKES PO IRON. Providers:                Barney Drain MD, MD, Lurline Del, RN, Charlsie Quest                            Theda Sers RN, RN Referring MD:             Glenda Chroman Medicines:                Meperidine 50 mg IV, Midazolam 4 mg IV Complications:            No immediate complications. Estimated Blood Loss:     Estimated blood loss was minimal. Procedure:                Pre-Anesthesia Assessment:                           - Prior to the procedure, a History and Physical                            was performed, and patient medications and                            allergies were reviewed. The patient's tolerance of                            previous anesthesia was also reviewed. The risks                            and benefits of the procedure and the sedation                            options and risks were discussed with the patient.                            All questions were answered, and informed consent                            was obtained. Prior Anticoagulants: The patient has                            taken no previous anticoagulant or antiplatelet                            agents. ASA Grade Assessment: II - A patient with                            mild systemic disease. After reviewing the risks  and benefits, the patient was deemed in                            satisfactory condition to undergo the procedure.                            After obtaining informed consent, the endoscope was                            passed under direct vision. Throughout the                            procedure, the patient's  blood pressure, pulse, and                            oxygen saturations were monitored continuously. The                            [id number] ([id number]) scope was introduced through the                            mouth, and advanced to the second part of duodenum.                            The upper GI endoscopy was accomplished without                            difficulty. The patient tolerated the procedure                            well. Scope In: 12:55:29 PM Scope Out: 1:10:13 PM Total Procedure Duration: 0 hours 14 minutes 44 seconds  Findings:      The examined esophagus was normal.      A single 4 mm sessile polyp with no stigmata of recent bleeding was       found in the cardia. The polyp was removed with a cold biopsy forceps.       Resection and retrieval were complete.      Localized mild inflammation characterized by congestion (edema) was       found in the gastric antrum. Biopsies were taken with a cold forceps for       Helicobacter pylori testing.      The examined duodenum was normal. Biopsies for histology were taken with       a cold forceps for evaluation of celiac disease. Impression:               - A single gastric polyp. Resected and retrieved.                           - MILD Gastritis. Biopsied.                           - NO HIGH RISK LESION OR OBVIOUS SOURCE FOR ANEMIA  IDENTIFIED. Moderate Sedation:      Moderate (conscious) sedation was administered by the endoscopy nurse       and supervised by the endoscopist. The following parameters were       monitored: oxygen saturation, heart rate, blood pressure, and response       to care. Total physician intraservice time was 25 minutes. Recommendation:           - Await pathology results. NEEDS GIVENS CAPSULE IF                            NO SOURCE FOR ANEMIA IDENTIFIED.                           - Soft diet and diabetic (ADA) diet.                           - Continue present  medications. CONTINUE PO IRON.                           - Return to my office in 3 months. OK TO D/C HOME                            MAY 4.                           - Return patient to hospital ward for ongoing care. Procedure Code(s):        --- Professional ---                           [phone], Esophagogastroduodenoscopy, flexible,                            transoral; with biopsy, single or multiple                           G0500, Moderate sedation services provided by the                            same physician or other qualified health care                            professional performing a gastrointestinal                            endoscopic service that sedation supports,                            requiring the presence of an independent trained                            observer to assist in the monitoring of the                            patient's level of consciousness and physiological  status; initial 15 minutes of intra-service time;                            patient age 46 years or older (additional time may                            be reported with [phone], as appropriate)                           [phone], Moderate sedation services provided by the                            same physician or other qualified health care                            professional performing the diagnostic or                            therapeutic service that the sedation supports,                            requiring the presence of an independent trained                            observer to assist in the monitoring of the                            patient's level of consciousness and physiological                            status; each additional 15 minutes intraservice                            time (List separately in addition to code for                            primary service) Diagnosis Code(s):        --- Professional ---                            K31.7, Polyp of stomach and duodenum                           K29.70, Gastritis, unspecified, without bleeding                           D64.9, Anemia, unspecified CPT copyright 2017 American Medical Association. All rights reserved. The codes documented in this report are preliminary and upon coder review may  be revised to meet current compliance requirements. Barney Drain, MD Barney Drain MD, MD 03/01/2018 1:20:38 PM This report has been signed electronically. Number of Addenda: 0

## 2018-03-01 NOTE — Progress Notes (Signed)
CSW contacted by RN that patient admitted from [facility], and plan is to return this afternoon. CSW contacted [facility], and asked about patient returning. Per nursing supervisor at [facility], patient cannot return until Monday when the social worker in Admissions returns to the office. Facility does not accept return admissions over the weekend unless they've been previously set up with Admissions before they leave the office, and hers was not set up.  CSW called RN back to provide information. CSW spoke with patient's daughter at bedside to relay information. Per patient's daughter, she was told that the patient could return, but it appears that the Admissions staff never set anything up for the patient to be able to return today.   CSW will follow up on Monday.  Laveda Abbe, [place] Clinical Social Worker [phone]

## 2018-03-02 LAB — TYPE AND SCREEN
ABO/RH(D): O POS
Antibody Screen: NEGATIVE
UNIT DIVISION: 0

## 2018-03-02 LAB — BPAM RBC
Blood Product Expiration Date: 201905292359
ISSUE DATE / TIME: 201905041424
Unit Type and Rh: 5100

## 2018-03-03 DIAGNOSIS — D649 Anemia, unspecified: Secondary | ICD-10-CM | POA: Diagnosis not present

## 2018-03-03 DIAGNOSIS — K922 Gastrointestinal hemorrhage, unspecified: Secondary | ICD-10-CM | POA: Diagnosis not present

## 2018-03-03 DIAGNOSIS — G309 Alzheimer's disease, unspecified: Secondary | ICD-10-CM | POA: Diagnosis not present

## 2018-03-03 DIAGNOSIS — F0390 Unspecified dementia without behavioral disturbance: Secondary | ICD-10-CM | POA: Diagnosis not present

## 2018-03-04 ENCOUNTER — Encounter (HOSPITAL_COMMUNITY): Payer: Self-pay | Admitting: Gastroenterology

## 2018-03-05 DIAGNOSIS — G309 Alzheimer's disease, unspecified: Secondary | ICD-10-CM | POA: Diagnosis not present

## 2018-03-05 DIAGNOSIS — D649 Anemia, unspecified: Secondary | ICD-10-CM | POA: Diagnosis not present

## 2018-03-05 DIAGNOSIS — F0390 Unspecified dementia without behavioral disturbance: Secondary | ICD-10-CM | POA: Diagnosis not present

## 2018-03-05 DIAGNOSIS — K922 Gastrointestinal hemorrhage, unspecified: Secondary | ICD-10-CM | POA: Diagnosis not present

## 2018-03-06 ENCOUNTER — Telehealth: Payer: Self-pay | Admitting: Gastroenterology

## 2018-03-06 DIAGNOSIS — E119 Type 2 diabetes mellitus without complications: Secondary | ICD-10-CM | POA: Diagnosis not present

## 2018-03-06 DIAGNOSIS — I10 Essential (primary) hypertension: Secondary | ICD-10-CM | POA: Diagnosis not present

## 2018-03-06 DIAGNOSIS — Z7984 Long term (current) use of oral hypoglycemic drugs: Secondary | ICD-10-CM | POA: Diagnosis not present

## 2018-03-06 DIAGNOSIS — F0390 Unspecified dementia without behavioral disturbance: Secondary | ICD-10-CM | POA: Diagnosis not present

## 2018-03-06 DIAGNOSIS — D649 Anemia, unspecified: Secondary | ICD-10-CM | POA: Diagnosis not present

## 2018-03-06 DIAGNOSIS — K922 Gastrointestinal hemorrhage, unspecified: Secondary | ICD-10-CM | POA: Diagnosis not present

## 2018-03-06 DIAGNOSIS — Z9181 History of falling: Secondary | ICD-10-CM | POA: Diagnosis not present

## 2018-03-06 NOTE — Telephone Encounter (Signed)
Tried to call and line is busy.

## 2018-03-06 NOTE — Telephone Encounter (Signed)
Please call pt. HER stomach Bx shows mild gastritis/DUODENITIS.   NEEDS GIVENS CAPSULE, Dx: ANEMIA. HOLD IRON FOR 7 DAYS PRIOR TO STUDY. HOLD AMARYL ON DAY OF GIVENS STUDY. MAY CONTINUE GLUCOPHAGE.  - Return to my office in 3 months E30 ANEMIA.

## 2018-03-07 ENCOUNTER — Other Ambulatory Visit (HOSPITAL_COMMUNITY)
Admission: RE | Admit: 2018-03-07 | Discharge: 2018-03-07 | Disposition: A | Discharge status: Home / Self Care | Payer: Medicare Other | Source: Other Acute Inpatient Hospital | Attending: Internal Medicine | Admitting: Internal Medicine

## 2018-03-07 ENCOUNTER — Encounter: Payer: Self-pay | Admitting: Gastroenterology

## 2018-03-07 DIAGNOSIS — D649 Anemia, unspecified: Secondary | ICD-10-CM | POA: Diagnosis not present

## 2018-03-07 DIAGNOSIS — Z7984 Long term (current) use of oral hypoglycemic drugs: Secondary | ICD-10-CM | POA: Diagnosis not present

## 2018-03-07 DIAGNOSIS — K922 Gastrointestinal hemorrhage, unspecified: Secondary | ICD-10-CM | POA: Diagnosis not present

## 2018-03-07 DIAGNOSIS — I10 Essential (primary) hypertension: Secondary | ICD-10-CM | POA: Diagnosis not present

## 2018-03-07 DIAGNOSIS — F0390 Unspecified dementia without behavioral disturbance: Secondary | ICD-10-CM | POA: Diagnosis not present

## 2018-03-07 DIAGNOSIS — E119 Type 2 diabetes mellitus without complications: Secondary | ICD-10-CM | POA: Diagnosis not present

## 2018-03-07 LAB — BASIC METABOLIC PANEL
Anion gap: 12 (ref 5–15)
BUN: 20 mg/dL (ref 6–20)
CHLORIDE: 102 mmol/L (ref 101–111)
CO2: 24 mmol/L (ref 22–32)
CREATININE: 1.03 mg/dL — AB (ref 0.44–1.00)
Calcium: 8.8 mg/dL — ABNORMAL LOW (ref 8.9–10.3)
GFR, EST AFRICAN AMERICAN: 58 mL/min — AB (ref >60)
GFR, EST NON AFRICAN AMERICAN: 50 mL/min — AB (ref >60)
Glucose, Bld: 136 mg/dL — ABNORMAL HIGH (ref 65–99)
POTASSIUM: 3.6 mmol/L (ref 3.5–5.1)
Sodium: 138 mmol/L (ref 135–145)

## 2018-03-07 LAB — CBC
HCT: 32 % — ABNORMAL LOW (ref 36.0–46.0)
Hemoglobin: 10.1 g/dL — ABNORMAL LOW (ref 12.0–15.0)
MCH: 31.5 pg (ref 26.0–34.0)
MCHC: 31.6 g/dL (ref 30.0–36.0)
MCV: 99.7 fL (ref 78.0–100.0)
PLATELETS: 322 10*3/uL (ref 150–400)
RBC: 3.21 MIL/uL — AB (ref 3.87–5.11)
RDW: 16.5 % — AB (ref 11.5–15.5)
WBC: 7.7 10*3/uL (ref 4.0–10.5)

## 2018-03-07 NOTE — Telephone Encounter (Signed)
PATIENT SCHEDULED  °

## 2018-03-10 ENCOUNTER — Other Ambulatory Visit: Payer: Self-pay

## 2018-03-10 DIAGNOSIS — D649 Anemia, unspecified: Secondary | ICD-10-CM

## 2018-03-10 NOTE — Telephone Encounter (Signed)
Called pt, she asked for me to speak to her husband. Spoke to husband. Givens scheduled for 03/20/18 at 7:30am. Informed him she will need to start holding Iron 03/13/18. Verbalized understanding. Instructions mailed. Orders entered.

## 2018-03-10 NOTE — Telephone Encounter (Signed)
Changed arrival time on printed instructions to 7:00am. Called and informed her husband.

## 2018-03-10 NOTE — Telephone Encounter (Signed)
Spoke to pt and made her aware and she wanted me to speak to her husband. He said OK to schedule, but they are leaving for vacation the first of June. Would like for it to be done before then. Forwarding to [facility] to schedule.

## 2018-03-11 DIAGNOSIS — I10 Essential (primary) hypertension: Secondary | ICD-10-CM | POA: Diagnosis not present

## 2018-03-11 DIAGNOSIS — D649 Anemia, unspecified: Secondary | ICD-10-CM | POA: Diagnosis not present

## 2018-03-11 DIAGNOSIS — K922 Gastrointestinal hemorrhage, unspecified: Secondary | ICD-10-CM | POA: Diagnosis not present

## 2018-03-11 DIAGNOSIS — Z7984 Long term (current) use of oral hypoglycemic drugs: Secondary | ICD-10-CM | POA: Diagnosis not present

## 2018-03-11 DIAGNOSIS — E119 Type 2 diabetes mellitus without complications: Secondary | ICD-10-CM | POA: Diagnosis not present

## 2018-03-11 DIAGNOSIS — F0390 Unspecified dementia without behavioral disturbance: Secondary | ICD-10-CM | POA: Diagnosis not present

## 2018-03-12 DIAGNOSIS — Z6831 Body mass index (BMI) 31.0-31.9, adult: Secondary | ICD-10-CM | POA: Diagnosis not present

## 2018-03-12 DIAGNOSIS — D649 Anemia, unspecified: Secondary | ICD-10-CM | POA: Diagnosis not present

## 2018-03-12 DIAGNOSIS — Z09 Encounter for follow-up examination after completed treatment for conditions other than malignant neoplasm: Secondary | ICD-10-CM | POA: Diagnosis not present

## 2018-03-12 DIAGNOSIS — Z299 Encounter for prophylactic measures, unspecified: Secondary | ICD-10-CM | POA: Diagnosis not present

## 2018-03-12 DIAGNOSIS — E1165 Type 2 diabetes mellitus with hyperglycemia: Secondary | ICD-10-CM | POA: Diagnosis not present

## 2018-03-14 DIAGNOSIS — K922 Gastrointestinal hemorrhage, unspecified: Secondary | ICD-10-CM | POA: Diagnosis not present

## 2018-03-14 DIAGNOSIS — Z7984 Long term (current) use of oral hypoglycemic drugs: Secondary | ICD-10-CM | POA: Diagnosis not present

## 2018-03-14 DIAGNOSIS — I10 Essential (primary) hypertension: Secondary | ICD-10-CM | POA: Diagnosis not present

## 2018-03-14 DIAGNOSIS — E119 Type 2 diabetes mellitus without complications: Secondary | ICD-10-CM | POA: Diagnosis not present

## 2018-03-14 DIAGNOSIS — D649 Anemia, unspecified: Secondary | ICD-10-CM | POA: Diagnosis not present

## 2018-03-14 DIAGNOSIS — F0390 Unspecified dementia without behavioral disturbance: Secondary | ICD-10-CM | POA: Diagnosis not present

## 2018-03-20 ENCOUNTER — Encounter (HOSPITAL_COMMUNITY)
Admission: RE | Disposition: A | Discharge status: Home / Self Care | Payer: Self-pay | Source: Ambulatory Visit | Attending: Gastroenterology

## 2018-03-20 ENCOUNTER — Ambulatory Visit (HOSPITAL_COMMUNITY)
Admission: RE | Admit: 2018-03-20 | Discharge: 2018-03-20 | Disposition: A | Discharge status: Home / Self Care | Payer: Medicare Other | Source: Ambulatory Visit | Attending: Gastroenterology | Admitting: Gastroenterology

## 2018-03-20 DIAGNOSIS — D62 Acute posthemorrhagic anemia: Secondary | ICD-10-CM | POA: Insufficient documentation

## 2018-03-20 DIAGNOSIS — K2971 Gastritis, unspecified, with bleeding: Secondary | ICD-10-CM | POA: Diagnosis not present

## 2018-03-20 DIAGNOSIS — D649 Anemia, unspecified: Secondary | ICD-10-CM | POA: Diagnosis not present

## 2018-03-20 DIAGNOSIS — K529 Noninfective gastroenteritis and colitis, unspecified: Secondary | ICD-10-CM | POA: Diagnosis not present

## 2018-03-20 HISTORY — PX: GIVENS CAPSULE STUDY: SHX5432

## 2018-03-20 SURGERY — IMAGING PROCEDURE, GI TRACT, INTRALUMINAL, VIA CAPSULE

## 2018-03-20 MED ORDER — SIMETHICONE 40 MG/0.6ML PO SUSP
ORAL | Status: AC
  Filled 2018-03-20: qty 0.6

## 2018-03-21 ENCOUNTER — Encounter (HOSPITAL_COMMUNITY): Payer: Self-pay | Admitting: Gastroenterology

## 2018-03-21 DIAGNOSIS — E119 Type 2 diabetes mellitus without complications: Secondary | ICD-10-CM | POA: Diagnosis not present

## 2018-03-21 DIAGNOSIS — K922 Gastrointestinal hemorrhage, unspecified: Secondary | ICD-10-CM | POA: Diagnosis not present

## 2018-03-21 DIAGNOSIS — D649 Anemia, unspecified: Secondary | ICD-10-CM | POA: Diagnosis not present

## 2018-03-21 DIAGNOSIS — Z7984 Long term (current) use of oral hypoglycemic drugs: Secondary | ICD-10-CM | POA: Diagnosis not present

## 2018-03-21 DIAGNOSIS — I10 Essential (primary) hypertension: Secondary | ICD-10-CM | POA: Diagnosis not present

## 2018-03-21 DIAGNOSIS — F0390 Unspecified dementia without behavioral disturbance: Secondary | ICD-10-CM | POA: Diagnosis not present

## 2018-03-25 DIAGNOSIS — D649 Anemia, unspecified: Secondary | ICD-10-CM | POA: Diagnosis not present

## 2018-03-25 DIAGNOSIS — F0390 Unspecified dementia without behavioral disturbance: Secondary | ICD-10-CM | POA: Diagnosis not present

## 2018-03-25 DIAGNOSIS — E119 Type 2 diabetes mellitus without complications: Secondary | ICD-10-CM | POA: Diagnosis not present

## 2018-03-25 DIAGNOSIS — K922 Gastrointestinal hemorrhage, unspecified: Secondary | ICD-10-CM | POA: Diagnosis not present

## 2018-03-25 DIAGNOSIS — Z7984 Long term (current) use of oral hypoglycemic drugs: Secondary | ICD-10-CM | POA: Diagnosis not present

## 2018-03-25 DIAGNOSIS — I10 Essential (primary) hypertension: Secondary | ICD-10-CM | POA: Diagnosis not present

## 2018-04-02 NOTE — Procedures (Signed)
HPI: TRANSFUSION DEPENDENT ANEMIA. TCS APR 14 AT [facility](NO ACUTE FINDINGS). May 4: pt admitted with ? Melena on po iron. EGD: NO SOURCE FOR ANEMIA IDENTIFIED. May 10 Hb 10.1.   PATIENT DATA: WEIGHT:  157 lbs    WAIST:   36 IN   HEIGHT: 59 IN  GASTRIC PASSAGE TIME: 1H 67m, SB PASSAGE TIME: 3H 66m  RESULTS: LIMITED views of gastric mucosa due to retained contents. OCCASIONAL EROSION SEEN: SMALL BOWEL: OCCASIONAL EROSIONS SEEN 2:21 TO 4:55.  No ULCERS, masses or AVMs seen.  LIMITED VIEWS OF THE COLON DUE TO RETAINED CONTENTS. No old blood or fresh blood in the stomach, small bowel, or colon.  DIAGNOSIS:OBSCURE GI BLEED/GASTRITIS/MILD ENTERITIS  Plan: 1. CONTINUE PO IRON. 2. CHECK CBC NEXT WEEK. 3. OPV IN 3 MOS. CBC/FERRITIN 1 WEEK PRIOR TO NEXT OPV.

## 2018-04-03 ENCOUNTER — Other Ambulatory Visit: Payer: Self-pay

## 2018-04-03 ENCOUNTER — Telehealth: Payer: Self-pay | Admitting: Gastroenterology

## 2018-04-03 ENCOUNTER — Encounter: Payer: Self-pay | Admitting: Gastroenterology

## 2018-04-03 DIAGNOSIS — R7989 Other specified abnormal findings of blood chemistry: Secondary | ICD-10-CM

## 2018-04-03 NOTE — Telephone Encounter (Signed)
OV made and letter mailed. The OV with [facility] in August was cancelled.

## 2018-04-03 NOTE — Telephone Encounter (Signed)
Called. Many rings and no answer. Mailing a letter with results and the lab order for next week.

## 2018-04-03 NOTE — Telephone Encounter (Signed)
PLEASE CALL PT. HER GIVENS SHows GASTRITIS& MILD ENTERITIS,which is inflammation of the small bowel. SHE SHOULD:  Plan: 1. CONTINUE PO IRON. 2. CHECK CBC NEXT WEEK. 3. OPV IN 3 MOS WITH DR.Aarica Wax. CBC/FERRITIN 1 WEEK PRIOR TO NEXT OPV.

## 2018-04-07 DIAGNOSIS — F0390 Unspecified dementia without behavioral disturbance: Secondary | ICD-10-CM | POA: Diagnosis not present

## 2018-04-07 DIAGNOSIS — I10 Essential (primary) hypertension: Secondary | ICD-10-CM | POA: Diagnosis not present

## 2018-04-07 DIAGNOSIS — E119 Type 2 diabetes mellitus without complications: Secondary | ICD-10-CM | POA: Diagnosis not present

## 2018-04-07 DIAGNOSIS — D649 Anemia, unspecified: Secondary | ICD-10-CM | POA: Diagnosis not present

## 2018-04-07 DIAGNOSIS — K922 Gastrointestinal hemorrhage, unspecified: Secondary | ICD-10-CM | POA: Diagnosis not present

## 2018-04-07 DIAGNOSIS — Z7984 Long term (current) use of oral hypoglycemic drugs: Secondary | ICD-10-CM | POA: Diagnosis not present

## 2018-04-15 DIAGNOSIS — Z6833 Body mass index (BMI) 33.0-33.9, adult: Secondary | ICD-10-CM | POA: Diagnosis not present

## 2018-04-15 DIAGNOSIS — E1165 Type 2 diabetes mellitus with hyperglycemia: Secondary | ICD-10-CM | POA: Diagnosis not present

## 2018-04-15 DIAGNOSIS — E7800 Pure hypercholesterolemia, unspecified: Secondary | ICD-10-CM | POA: Diagnosis not present

## 2018-04-15 DIAGNOSIS — I10 Essential (primary) hypertension: Secondary | ICD-10-CM | POA: Diagnosis not present

## 2018-04-15 DIAGNOSIS — Z299 Encounter for prophylactic measures, unspecified: Secondary | ICD-10-CM | POA: Diagnosis not present

## 2018-04-15 DIAGNOSIS — F0390 Unspecified dementia without behavioral disturbance: Secondary | ICD-10-CM | POA: Diagnosis not present

## 2018-05-21 ENCOUNTER — Other Ambulatory Visit: Payer: Self-pay

## 2018-05-21 DIAGNOSIS — R7989 Other specified abnormal findings of blood chemistry: Secondary | ICD-10-CM

## 2018-06-11 ENCOUNTER — Ambulatory Visit: Payer: Self-pay | Admitting: Gastroenterology

## 2018-07-02 ENCOUNTER — Ambulatory Visit: Payer: Medicare Other | Admitting: Gastroenterology

## 2018-07-04 DIAGNOSIS — Z713 Dietary counseling and surveillance: Secondary | ICD-10-CM | POA: Diagnosis not present

## 2018-07-04 DIAGNOSIS — Z6835 Body mass index (BMI) 35.0-35.9, adult: Secondary | ICD-10-CM | POA: Diagnosis not present

## 2018-07-04 DIAGNOSIS — Z299 Encounter for prophylactic measures, unspecified: Secondary | ICD-10-CM | POA: Diagnosis not present

## 2018-07-04 DIAGNOSIS — I10 Essential (primary) hypertension: Secondary | ICD-10-CM | POA: Diagnosis not present

## 2018-07-04 DIAGNOSIS — M549 Dorsalgia, unspecified: Secondary | ICD-10-CM | POA: Diagnosis not present

## 2018-07-04 DIAGNOSIS — M545 Low back pain: Secondary | ICD-10-CM | POA: Diagnosis not present

## 2018-07-04 DIAGNOSIS — M47816 Spondylosis without myelopathy or radiculopathy, lumbar region: Secondary | ICD-10-CM | POA: Diagnosis not present

## 2018-08-07 DIAGNOSIS — Z23 Encounter for immunization: Secondary | ICD-10-CM | POA: Diagnosis not present

## 2018-08-07 DIAGNOSIS — R2681 Unsteadiness on feet: Secondary | ICD-10-CM | POA: Diagnosis not present

## 2018-08-07 DIAGNOSIS — F0390 Unspecified dementia without behavioral disturbance: Secondary | ICD-10-CM | POA: Diagnosis not present

## 2018-08-07 DIAGNOSIS — Z299 Encounter for prophylactic measures, unspecified: Secondary | ICD-10-CM | POA: Diagnosis not present

## 2018-08-07 DIAGNOSIS — Z6836 Body mass index (BMI) 36.0-36.9, adult: Secondary | ICD-10-CM | POA: Diagnosis not present

## 2018-08-07 DIAGNOSIS — E1165 Type 2 diabetes mellitus with hyperglycemia: Secondary | ICD-10-CM | POA: Diagnosis not present

## 2018-08-07 DIAGNOSIS — I10 Essential (primary) hypertension: Secondary | ICD-10-CM | POA: Diagnosis not present

## 2018-08-07 DIAGNOSIS — R42 Dizziness and giddiness: Secondary | ICD-10-CM | POA: Diagnosis not present

## 2018-08-12 DIAGNOSIS — F0390 Unspecified dementia without behavioral disturbance: Secondary | ICD-10-CM | POA: Diagnosis not present

## 2018-08-12 DIAGNOSIS — E1165 Type 2 diabetes mellitus with hyperglycemia: Secondary | ICD-10-CM | POA: Diagnosis not present

## 2018-08-12 DIAGNOSIS — I6381 Other cerebral infarction due to occlusion or stenosis of small artery: Secondary | ICD-10-CM | POA: Diagnosis not present

## 2018-08-12 DIAGNOSIS — I10 Essential (primary) hypertension: Secondary | ICD-10-CM | POA: Diagnosis not present

## 2018-08-12 DIAGNOSIS — Z6835 Body mass index (BMI) 35.0-35.9, adult: Secondary | ICD-10-CM | POA: Diagnosis not present

## 2018-08-12 DIAGNOSIS — R42 Dizziness and giddiness: Secondary | ICD-10-CM | POA: Diagnosis not present

## 2018-08-12 DIAGNOSIS — Z299 Encounter for prophylactic measures, unspecified: Secondary | ICD-10-CM | POA: Diagnosis not present

## 2018-08-19 DIAGNOSIS — E1159 Type 2 diabetes mellitus with other circulatory complications: Secondary | ICD-10-CM | POA: Diagnosis not present

## 2018-08-19 DIAGNOSIS — E1140 Type 2 diabetes mellitus with diabetic neuropathy, unspecified: Secondary | ICD-10-CM | POA: Diagnosis not present

## 2018-08-19 DIAGNOSIS — H81393 Other peripheral vertigo, bilateral: Secondary | ICD-10-CM | POA: Diagnosis not present

## 2018-09-16 DIAGNOSIS — Z1331 Encounter for screening for depression: Secondary | ICD-10-CM | POA: Diagnosis not present

## 2018-09-16 DIAGNOSIS — Z7189 Other specified counseling: Secondary | ICD-10-CM | POA: Diagnosis not present

## 2018-09-16 DIAGNOSIS — Z1339 Encounter for screening examination for other mental health and behavioral disorders: Secondary | ICD-10-CM | POA: Diagnosis not present

## 2018-09-16 DIAGNOSIS — Z299 Encounter for prophylactic measures, unspecified: Secondary | ICD-10-CM | POA: Diagnosis not present

## 2018-09-16 DIAGNOSIS — Z0000 Encounter for general adult medical examination without abnormal findings: Secondary | ICD-10-CM | POA: Diagnosis not present

## 2018-09-16 DIAGNOSIS — D649 Anemia, unspecified: Secondary | ICD-10-CM | POA: Diagnosis not present

## 2018-09-16 DIAGNOSIS — E7800 Pure hypercholesterolemia, unspecified: Secondary | ICD-10-CM | POA: Diagnosis not present

## 2018-09-16 DIAGNOSIS — I10 Essential (primary) hypertension: Secondary | ICD-10-CM | POA: Diagnosis not present

## 2018-09-16 DIAGNOSIS — R5383 Other fatigue: Secondary | ICD-10-CM | POA: Diagnosis not present

## 2018-09-16 DIAGNOSIS — Z79899 Other long term (current) drug therapy: Secondary | ICD-10-CM | POA: Diagnosis not present

## 2018-09-16 DIAGNOSIS — Z6835 Body mass index (BMI) 35.0-35.9, adult: Secondary | ICD-10-CM | POA: Diagnosis not present

## 2018-10-23 DIAGNOSIS — E2839 Other primary ovarian failure: Secondary | ICD-10-CM | POA: Diagnosis not present

## 2018-11-05 DIAGNOSIS — Z8673 Personal history of transient ischemic attack (TIA), and cerebral infarction without residual deficits: Secondary | ICD-10-CM | POA: Diagnosis not present

## 2018-11-05 DIAGNOSIS — R52 Pain, unspecified: Secondary | ICD-10-CM | POA: Diagnosis not present

## 2018-11-05 DIAGNOSIS — F0390 Unspecified dementia without behavioral disturbance: Secondary | ICD-10-CM | POA: Diagnosis not present

## 2018-11-05 DIAGNOSIS — Z7984 Long term (current) use of oral hypoglycemic drugs: Secondary | ICD-10-CM | POA: Diagnosis not present

## 2018-11-05 DIAGNOSIS — M810 Age-related osteoporosis without current pathological fracture: Secondary | ICD-10-CM | POA: Diagnosis not present

## 2018-11-05 DIAGNOSIS — I10 Essential (primary) hypertension: Secondary | ICD-10-CM | POA: Diagnosis not present

## 2018-11-05 DIAGNOSIS — S300XXA Contusion of lower back and pelvis, initial encounter: Secondary | ICD-10-CM | POA: Diagnosis not present

## 2018-11-05 DIAGNOSIS — W19XXXA Unspecified fall, initial encounter: Secondary | ICD-10-CM | POA: Diagnosis not present

## 2018-11-05 DIAGNOSIS — Z79899 Other long term (current) drug therapy: Secondary | ICD-10-CM | POA: Diagnosis not present

## 2018-12-05 DIAGNOSIS — Z299 Encounter for prophylactic measures, unspecified: Secondary | ICD-10-CM | POA: Diagnosis not present

## 2018-12-05 DIAGNOSIS — Z6837 Body mass index (BMI) 37.0-37.9, adult: Secondary | ICD-10-CM | POA: Diagnosis not present

## 2018-12-05 DIAGNOSIS — R300 Dysuria: Secondary | ICD-10-CM | POA: Diagnosis not present

## 2018-12-05 DIAGNOSIS — I10 Essential (primary) hypertension: Secondary | ICD-10-CM | POA: Diagnosis not present

## 2018-12-05 DIAGNOSIS — N390 Urinary tract infection, site not specified: Secondary | ICD-10-CM | POA: Diagnosis not present

## 2018-12-05 DIAGNOSIS — E1165 Type 2 diabetes mellitus with hyperglycemia: Secondary | ICD-10-CM | POA: Diagnosis not present

## 2018-12-05 DIAGNOSIS — E7800 Pure hypercholesterolemia, unspecified: Secondary | ICD-10-CM | POA: Diagnosis not present

## 2018-12-26 DIAGNOSIS — Z299 Encounter for prophylactic measures, unspecified: Secondary | ICD-10-CM | POA: Diagnosis not present

## 2018-12-26 DIAGNOSIS — R8290 Unspecified abnormal findings in urine: Secondary | ICD-10-CM | POA: Diagnosis not present

## 2018-12-26 DIAGNOSIS — I10 Essential (primary) hypertension: Secondary | ICD-10-CM | POA: Diagnosis not present

## 2018-12-26 DIAGNOSIS — R350 Frequency of micturition: Secondary | ICD-10-CM | POA: Diagnosis not present

## 2018-12-26 DIAGNOSIS — Z6837 Body mass index (BMI) 37.0-37.9, adult: Secondary | ICD-10-CM | POA: Diagnosis not present

## 2019-03-13 DIAGNOSIS — Z6837 Body mass index (BMI) 37.0-37.9, adult: Secondary | ICD-10-CM | POA: Diagnosis not present

## 2019-03-13 DIAGNOSIS — R600 Localized edema: Secondary | ICD-10-CM | POA: Diagnosis not present

## 2019-03-13 DIAGNOSIS — E1165 Type 2 diabetes mellitus with hyperglycemia: Secondary | ICD-10-CM | POA: Diagnosis not present

## 2019-03-13 DIAGNOSIS — I10 Essential (primary) hypertension: Secondary | ICD-10-CM | POA: Diagnosis not present

## 2019-03-13 DIAGNOSIS — F0390 Unspecified dementia without behavioral disturbance: Secondary | ICD-10-CM | POA: Diagnosis not present

## 2019-03-13 DIAGNOSIS — Z299 Encounter for prophylactic measures, unspecified: Secondary | ICD-10-CM | POA: Diagnosis not present

## 2019-03-17 IMAGING — CT CT HEAD W/O CM
3 series · 15 of 47 positions shown, 18 images · non-contrast
Comparison: October 18, 2017

CLINICAL DATA: Weakness for a month.  Hypotension.  Confusion.

EXAM:
CT HEAD WITHOUT CONTRAST
TECHNIQUE: Contiguous axial images were obtained from the base of the skull
through the vertex without intravenous contrast.

[Series 2: head wo · axial · 0.43mm/px · z∈[+22,+147]mm · 9 of 30 slices shown, 12 images]
[im 3/30  brain]
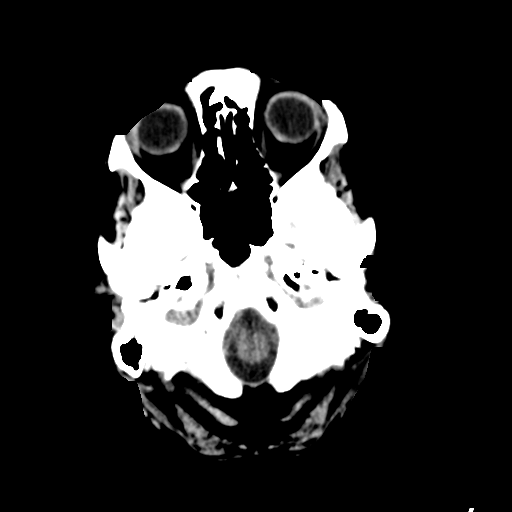
[im 3/30  bone]
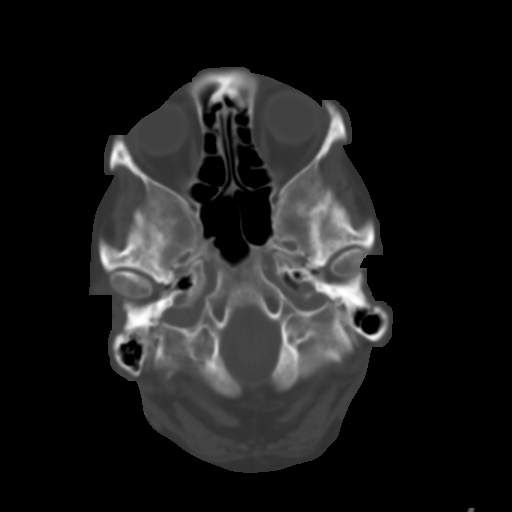
[im 6/30  brain]
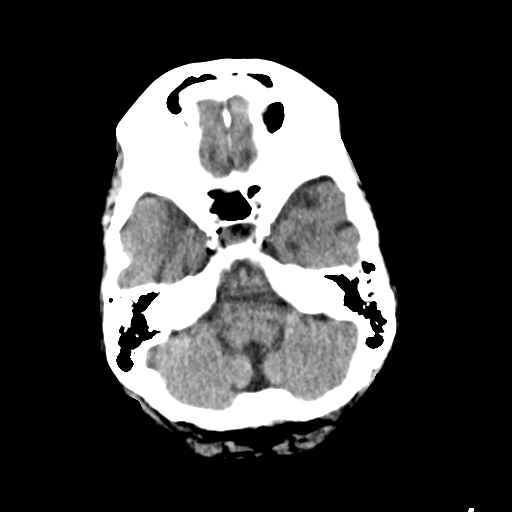
[im 9/30  brain]
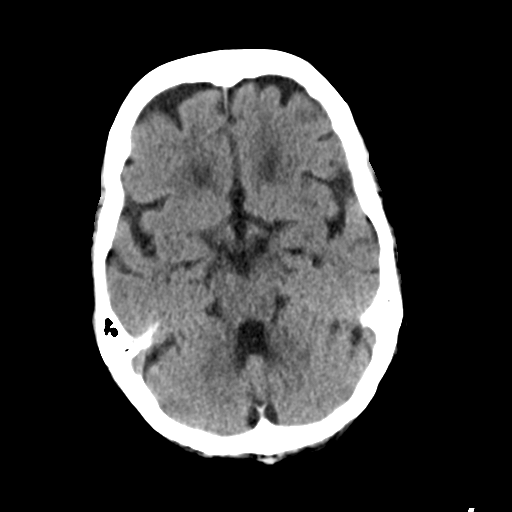
[im 12/30  brain]
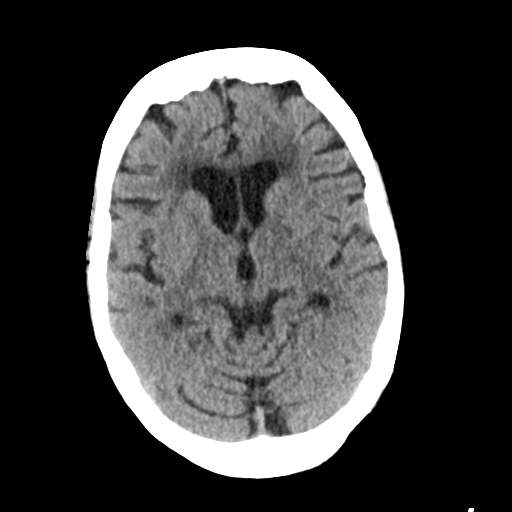
[im 16/30  brain]
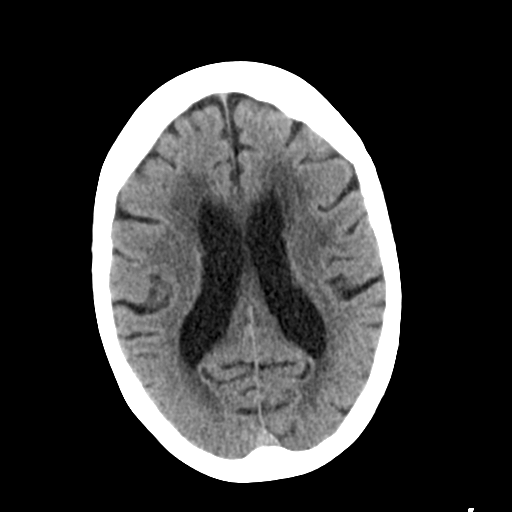
[im 16/30  bone]
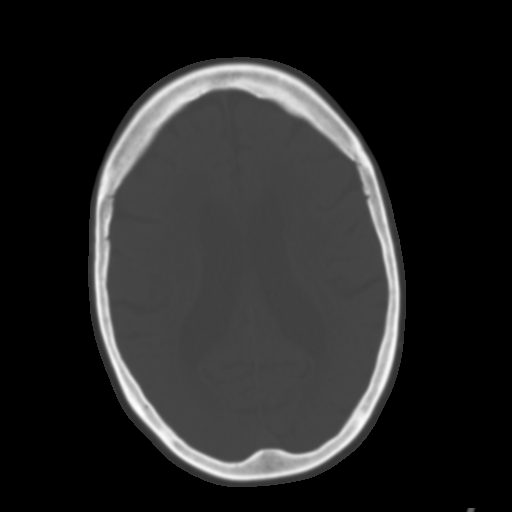
[im 19/30  brain]
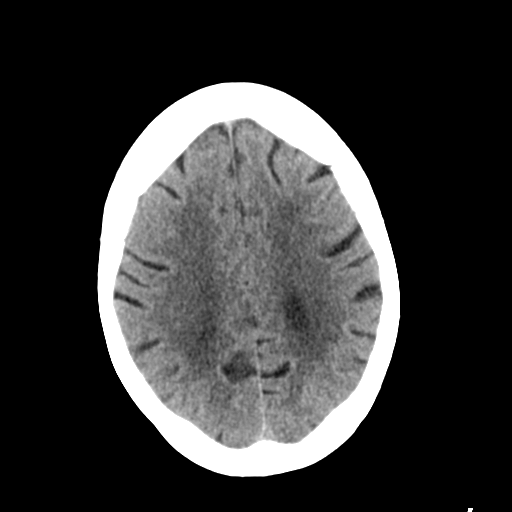
[im 22/30  brain]
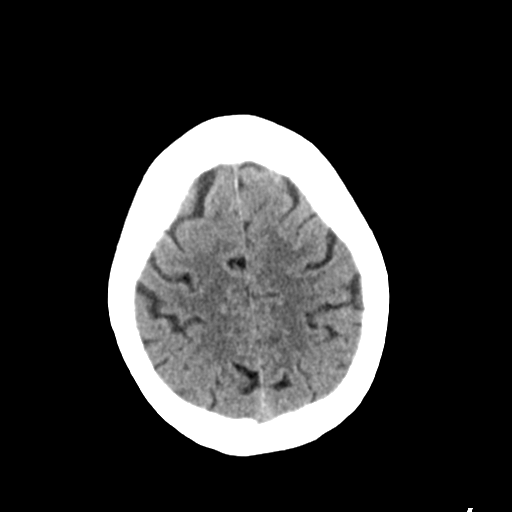
[im 25/30  brain]
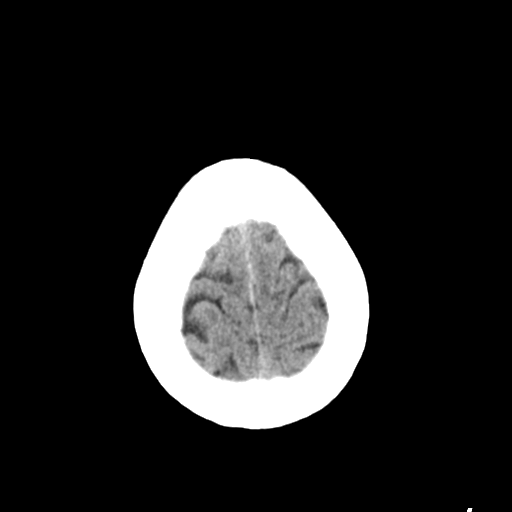
[im 28/30  brain]
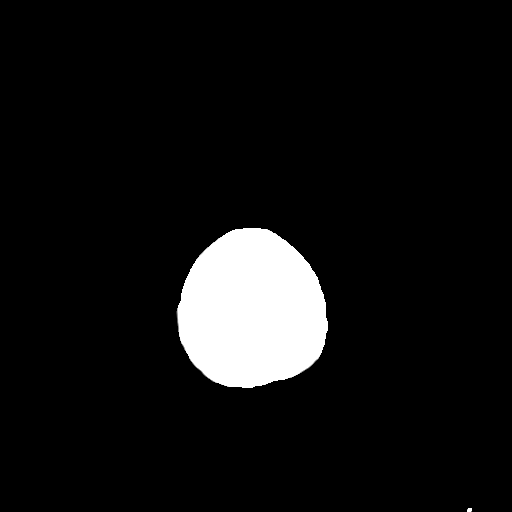
[im 28/30  bone]
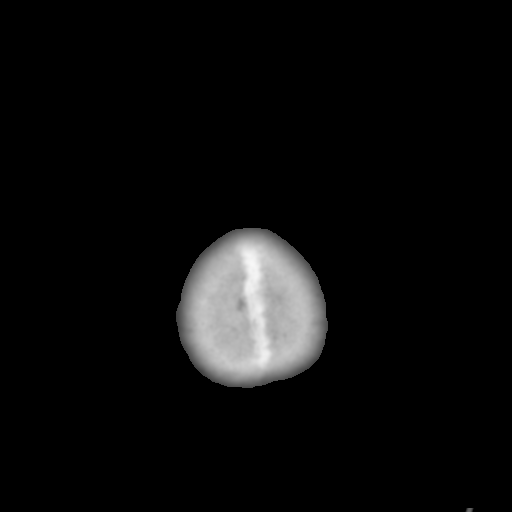

[Series 4: coronal soft tissue · coronal · 0.33mm/px · 3 of 70 slices shown]
[im 24/70  brain]
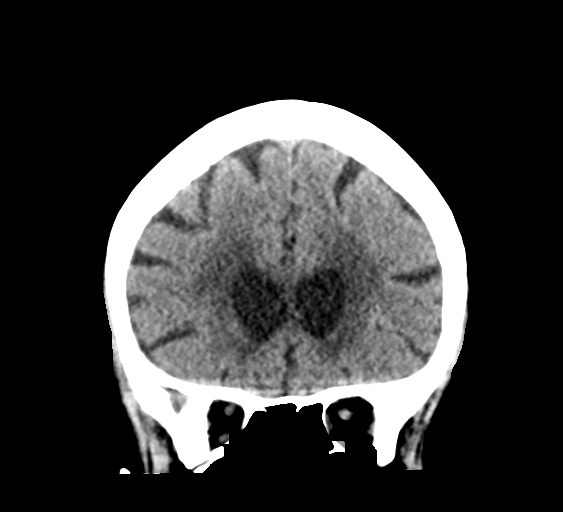
[im 31/70  brain]
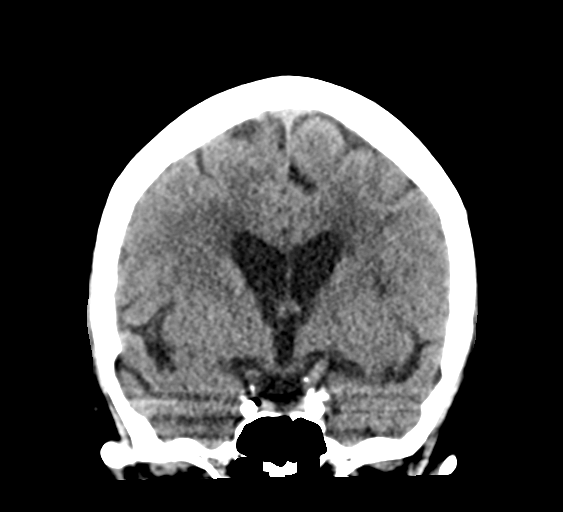
[im 39/70  brain]
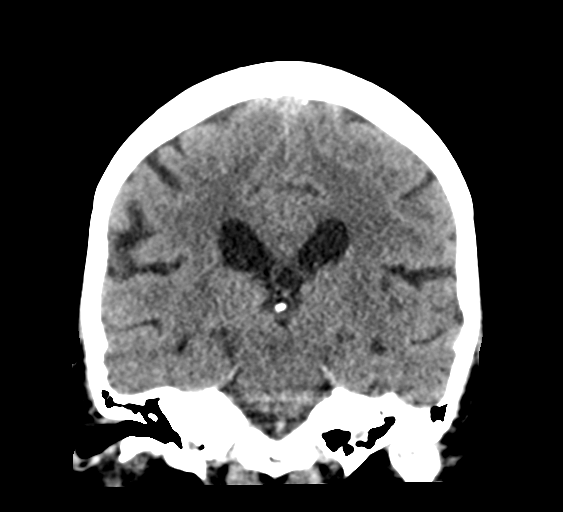

[Series 5: sagittal soft tissue · sagittal · 0.28mm/px · 3 of 60 slices shown]
[im 20/60  brain]
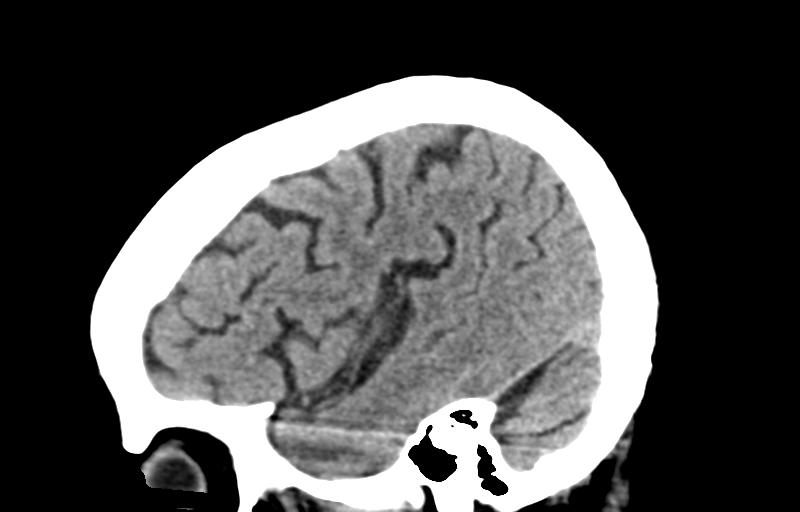
[im 30/60  brain]
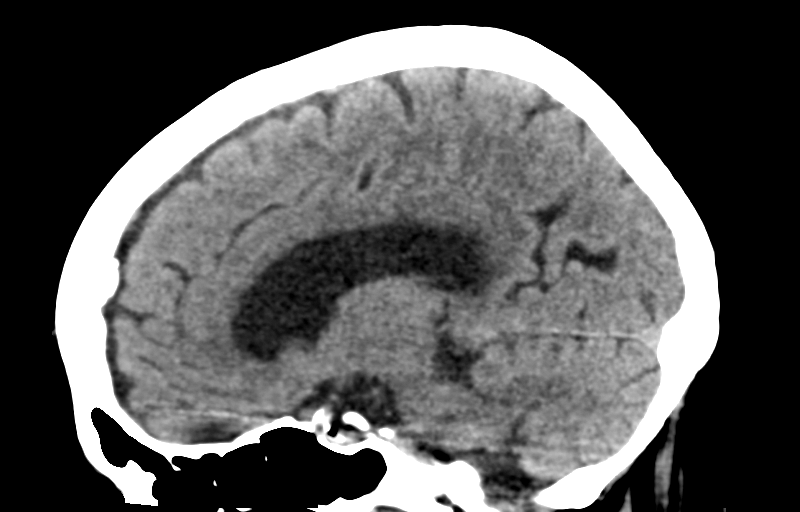
[im 40/60  brain]
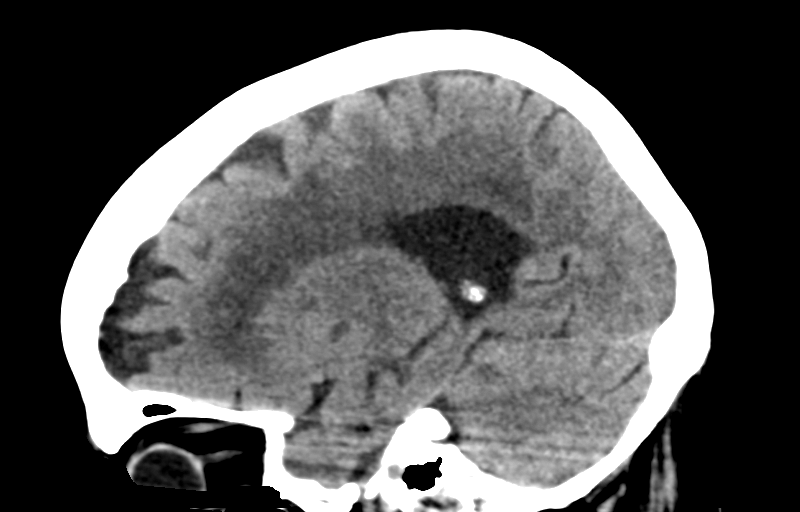

[15 of 47 positions shown; findings below may reference images not displayed]

FINDINGS: Brain: No subdural, epidural, or subarachnoid hemorrhage.
Cerebellum, brainstem, and basal cisterns are normal. Ventricles and
sulci are stable. White matter changes are stable. No acute cortical
ischemia or infarct. No mass effect or midline shift. Prominent
perivascular spaces versus lacunar infarcts in the left inferior
basal ganglia. These are favored to represent prominent perivascular
spaces.

Vascular: Calcified atherosclerosis in the intracranial carotids.

Skull: Normal. Negative for fracture or focal lesion.

Sinuses/Orbits: No acute finding.

Other: None.
IMPRESSION: No acute intracranial abnormalities to explain the patient's
symptoms.

## 2019-03-27 DIAGNOSIS — I10 Essential (primary) hypertension: Secondary | ICD-10-CM | POA: Diagnosis not present

## 2019-04-23 DIAGNOSIS — I10 Essential (primary) hypertension: Secondary | ICD-10-CM | POA: Diagnosis not present

## 2019-05-20 DIAGNOSIS — I10 Essential (primary) hypertension: Secondary | ICD-10-CM | POA: Diagnosis not present

## 2019-06-19 DIAGNOSIS — I10 Essential (primary) hypertension: Secondary | ICD-10-CM | POA: Diagnosis not present

## 2019-06-19 DIAGNOSIS — F0390 Unspecified dementia without behavioral disturbance: Secondary | ICD-10-CM | POA: Diagnosis not present

## 2019-06-19 DIAGNOSIS — Z299 Encounter for prophylactic measures, unspecified: Secondary | ICD-10-CM | POA: Diagnosis not present

## 2019-06-19 DIAGNOSIS — E1165 Type 2 diabetes mellitus with hyperglycemia: Secondary | ICD-10-CM | POA: Diagnosis not present

## 2019-06-19 DIAGNOSIS — Z6837 Body mass index (BMI) 37.0-37.9, adult: Secondary | ICD-10-CM | POA: Diagnosis not present

## 2019-06-19 DIAGNOSIS — E7800 Pure hypercholesterolemia, unspecified: Secondary | ICD-10-CM | POA: Diagnosis not present

## 2019-06-23 DIAGNOSIS — I10 Essential (primary) hypertension: Secondary | ICD-10-CM | POA: Diagnosis not present

## 2019-07-20 DIAGNOSIS — I10 Essential (primary) hypertension: Secondary | ICD-10-CM | POA: Diagnosis not present

## 2019-08-20 DIAGNOSIS — I10 Essential (primary) hypertension: Secondary | ICD-10-CM | POA: Diagnosis not present

## 2019-09-21 DIAGNOSIS — Z1211 Encounter for screening for malignant neoplasm of colon: Secondary | ICD-10-CM | POA: Diagnosis not present

## 2019-09-21 DIAGNOSIS — Z299 Encounter for prophylactic measures, unspecified: Secondary | ICD-10-CM | POA: Diagnosis not present

## 2019-09-21 DIAGNOSIS — I10 Essential (primary) hypertension: Secondary | ICD-10-CM | POA: Diagnosis not present

## 2019-09-21 DIAGNOSIS — Z7189 Other specified counseling: Secondary | ICD-10-CM | POA: Diagnosis not present

## 2019-09-21 DIAGNOSIS — R5383 Other fatigue: Secondary | ICD-10-CM | POA: Diagnosis not present

## 2019-09-21 DIAGNOSIS — Z1339 Encounter for screening examination for other mental health and behavioral disorders: Secondary | ICD-10-CM | POA: Diagnosis not present

## 2019-09-21 DIAGNOSIS — Z6837 Body mass index (BMI) 37.0-37.9, adult: Secondary | ICD-10-CM | POA: Diagnosis not present

## 2019-09-21 DIAGNOSIS — E1165 Type 2 diabetes mellitus with hyperglycemia: Secondary | ICD-10-CM | POA: Diagnosis not present

## 2019-09-21 DIAGNOSIS — Z1331 Encounter for screening for depression: Secondary | ICD-10-CM | POA: Diagnosis not present

## 2019-09-21 DIAGNOSIS — F0390 Unspecified dementia without behavioral disturbance: Secondary | ICD-10-CM | POA: Diagnosis not present

## 2019-09-21 DIAGNOSIS — Z0000 Encounter for general adult medical examination without abnormal findings: Secondary | ICD-10-CM | POA: Diagnosis not present

## 2019-09-21 DIAGNOSIS — E7800 Pure hypercholesterolemia, unspecified: Secondary | ICD-10-CM | POA: Diagnosis not present

## 2019-09-21 DIAGNOSIS — Z79899 Other long term (current) drug therapy: Secondary | ICD-10-CM | POA: Diagnosis not present

## 2019-09-23 DIAGNOSIS — I10 Essential (primary) hypertension: Secondary | ICD-10-CM | POA: Diagnosis not present

## 2019-09-23 DIAGNOSIS — Z299 Encounter for prophylactic measures, unspecified: Secondary | ICD-10-CM | POA: Diagnosis not present

## 2019-09-23 DIAGNOSIS — J069 Acute upper respiratory infection, unspecified: Secondary | ICD-10-CM | POA: Diagnosis not present

## 2019-09-23 DIAGNOSIS — E1165 Type 2 diabetes mellitus with hyperglycemia: Secondary | ICD-10-CM | POA: Diagnosis not present

## 2019-09-23 DIAGNOSIS — F0390 Unspecified dementia without behavioral disturbance: Secondary | ICD-10-CM | POA: Diagnosis not present

## 2019-09-23 DIAGNOSIS — Z6837 Body mass index (BMI) 37.0-37.9, adult: Secondary | ICD-10-CM | POA: Diagnosis not present

## 2019-10-29 DIAGNOSIS — I10 Essential (primary) hypertension: Secondary | ICD-10-CM | POA: Diagnosis not present

## 2019-11-25 DIAGNOSIS — I10 Essential (primary) hypertension: Secondary | ICD-10-CM | POA: Diagnosis not present

## 2019-12-27 DIAGNOSIS — I10 Essential (primary) hypertension: Secondary | ICD-10-CM | POA: Diagnosis not present

## 2020-01-01 DIAGNOSIS — Z6837 Body mass index (BMI) 37.0-37.9, adult: Secondary | ICD-10-CM | POA: Diagnosis not present

## 2020-01-01 DIAGNOSIS — E1165 Type 2 diabetes mellitus with hyperglycemia: Secondary | ICD-10-CM | POA: Diagnosis not present

## 2020-01-01 DIAGNOSIS — R600 Localized edema: Secondary | ICD-10-CM | POA: Diagnosis not present

## 2020-01-01 DIAGNOSIS — F0390 Unspecified dementia without behavioral disturbance: Secondary | ICD-10-CM | POA: Diagnosis not present

## 2020-01-01 DIAGNOSIS — Z299 Encounter for prophylactic measures, unspecified: Secondary | ICD-10-CM | POA: Diagnosis not present

## 2020-01-01 DIAGNOSIS — N1831 Chronic kidney disease, stage 3a: Secondary | ICD-10-CM | POA: Diagnosis not present

## 2020-01-01 DIAGNOSIS — I10 Essential (primary) hypertension: Secondary | ICD-10-CM | POA: Diagnosis not present

## 2020-01-26 DIAGNOSIS — I10 Essential (primary) hypertension: Secondary | ICD-10-CM | POA: Diagnosis not present

## 2020-01-26 DIAGNOSIS — R410 Disorientation, unspecified: Secondary | ICD-10-CM | POA: Diagnosis not present

## 2020-02-26 DIAGNOSIS — I10 Essential (primary) hypertension: Secondary | ICD-10-CM | POA: Diagnosis not present

## 2020-03-27 DIAGNOSIS — I10 Essential (primary) hypertension: Secondary | ICD-10-CM | POA: Diagnosis not present

## 2020-04-15 DIAGNOSIS — I10 Essential (primary) hypertension: Secondary | ICD-10-CM | POA: Diagnosis not present

## 2020-04-15 DIAGNOSIS — Z6837 Body mass index (BMI) 37.0-37.9, adult: Secondary | ICD-10-CM | POA: Diagnosis not present

## 2020-04-15 DIAGNOSIS — Z299 Encounter for prophylactic measures, unspecified: Secondary | ICD-10-CM | POA: Diagnosis not present

## 2020-04-15 DIAGNOSIS — F0390 Unspecified dementia without behavioral disturbance: Secondary | ICD-10-CM | POA: Diagnosis not present

## 2020-04-15 DIAGNOSIS — E1165 Type 2 diabetes mellitus with hyperglycemia: Secondary | ICD-10-CM | POA: Diagnosis not present

## 2020-04-27 DIAGNOSIS — I10 Essential (primary) hypertension: Secondary | ICD-10-CM | POA: Diagnosis not present

## 2020-05-27 DIAGNOSIS — I10 Essential (primary) hypertension: Secondary | ICD-10-CM | POA: Diagnosis not present

## 2020-06-28 DIAGNOSIS — I10 Essential (primary) hypertension: Secondary | ICD-10-CM | POA: Diagnosis not present

## 2020-06-29 DIAGNOSIS — Z7401 Bed confinement status: Secondary | ICD-10-CM | POA: Diagnosis not present

## 2020-06-29 DIAGNOSIS — F419 Anxiety disorder, unspecified: Secondary | ICD-10-CM | POA: Diagnosis not present

## 2020-06-29 DIAGNOSIS — G301 Alzheimer's disease with late onset: Secondary | ICD-10-CM | POA: Diagnosis not present

## 2020-06-29 DIAGNOSIS — R0902 Hypoxemia: Secondary | ICD-10-CM | POA: Diagnosis not present

## 2020-06-29 DIAGNOSIS — E119 Type 2 diabetes mellitus without complications: Secondary | ICD-10-CM | POA: Diagnosis not present

## 2020-06-29 DIAGNOSIS — E785 Hyperlipidemia, unspecified: Secondary | ICD-10-CM | POA: Diagnosis present

## 2020-06-29 DIAGNOSIS — R41841 Cognitive communication deficit: Secondary | ICD-10-CM | POA: Diagnosis not present

## 2020-06-29 DIAGNOSIS — I10 Essential (primary) hypertension: Secondary | ICD-10-CM | POA: Diagnosis not present

## 2020-06-29 DIAGNOSIS — R2689 Other abnormalities of gait and mobility: Secondary | ICD-10-CM | POA: Diagnosis not present

## 2020-06-29 DIAGNOSIS — J189 Pneumonia, unspecified organism: Secondary | ICD-10-CM | POA: Diagnosis not present

## 2020-06-29 DIAGNOSIS — E1165 Type 2 diabetes mellitus with hyperglycemia: Secondary | ICD-10-CM | POA: Diagnosis not present

## 2020-06-29 DIAGNOSIS — I700 Atherosclerosis of aorta: Secondary | ICD-10-CM | POA: Diagnosis not present

## 2020-06-29 DIAGNOSIS — Z66 Do not resuscitate: Secondary | ICD-10-CM | POA: Diagnosis present

## 2020-06-29 DIAGNOSIS — Z7984 Long term (current) use of oral hypoglycemic drugs: Secondary | ICD-10-CM | POA: Diagnosis not present

## 2020-06-29 DIAGNOSIS — G4700 Insomnia, unspecified: Secondary | ICD-10-CM | POA: Diagnosis not present

## 2020-06-29 DIAGNOSIS — G309 Alzheimer's disease, unspecified: Secondary | ICD-10-CM | POA: Diagnosis not present

## 2020-06-29 DIAGNOSIS — U071 COVID-19: Secondary | ICD-10-CM | POA: Diagnosis not present

## 2020-06-29 DIAGNOSIS — Z8616 Personal history of COVID-19: Secondary | ICD-10-CM | POA: Diagnosis not present

## 2020-06-29 DIAGNOSIS — F29 Unspecified psychosis not due to a substance or known physiological condition: Secondary | ICD-10-CM | POA: Diagnosis not present

## 2020-06-29 DIAGNOSIS — M6281 Muscle weakness (generalized): Secondary | ICD-10-CM | POA: Diagnosis not present

## 2020-06-29 DIAGNOSIS — J1282 Pneumonia due to coronavirus disease 2019: Secondary | ICD-10-CM | POA: Diagnosis present

## 2020-06-29 DIAGNOSIS — M1990 Unspecified osteoarthritis, unspecified site: Secondary | ICD-10-CM | POA: Diagnosis not present

## 2020-06-29 DIAGNOSIS — F0281 Dementia in other diseases classified elsewhere with behavioral disturbance: Secondary | ICD-10-CM | POA: Diagnosis not present

## 2020-06-29 DIAGNOSIS — M810 Age-related osteoporosis without current pathological fracture: Secondary | ICD-10-CM | POA: Diagnosis present

## 2020-06-29 DIAGNOSIS — R531 Weakness: Secondary | ICD-10-CM | POA: Diagnosis not present

## 2020-06-29 DIAGNOSIS — R5381 Other malaise: Secondary | ICD-10-CM | POA: Diagnosis not present

## 2020-07-11 DIAGNOSIS — E7800 Pure hypercholesterolemia, unspecified: Secondary | ICD-10-CM | POA: Diagnosis not present

## 2020-07-11 DIAGNOSIS — G309 Alzheimer's disease, unspecified: Secondary | ICD-10-CM | POA: Diagnosis not present

## 2020-07-11 DIAGNOSIS — I10 Essential (primary) hypertension: Secondary | ICD-10-CM | POA: Diagnosis not present

## 2020-07-11 DIAGNOSIS — R2689 Other abnormalities of gait and mobility: Secondary | ICD-10-CM | POA: Diagnosis not present

## 2020-07-11 DIAGNOSIS — N1831 Chronic kidney disease, stage 3a: Secondary | ICD-10-CM | POA: Diagnosis not present

## 2020-07-11 DIAGNOSIS — F419 Anxiety disorder, unspecified: Secondary | ICD-10-CM | POA: Diagnosis not present

## 2020-07-11 DIAGNOSIS — R5383 Other fatigue: Secondary | ICD-10-CM | POA: Diagnosis not present

## 2020-07-11 DIAGNOSIS — F0390 Unspecified dementia without behavioral disturbance: Secondary | ICD-10-CM | POA: Diagnosis not present

## 2020-07-11 DIAGNOSIS — Z23 Encounter for immunization: Secondary | ICD-10-CM | POA: Diagnosis not present

## 2020-07-11 DIAGNOSIS — R41841 Cognitive communication deficit: Secondary | ICD-10-CM | POA: Diagnosis not present

## 2020-07-11 DIAGNOSIS — Z8616 Personal history of COVID-19: Secondary | ICD-10-CM | POA: Diagnosis not present

## 2020-07-11 DIAGNOSIS — M810 Age-related osteoporosis without current pathological fracture: Secondary | ICD-10-CM | POA: Diagnosis not present

## 2020-07-11 DIAGNOSIS — R5381 Other malaise: Secondary | ICD-10-CM | POA: Diagnosis not present

## 2020-07-11 DIAGNOSIS — J1282 Pneumonia due to coronavirus disease 2019: Secondary | ICD-10-CM | POA: Diagnosis not present

## 2020-07-11 DIAGNOSIS — U071 COVID-19: Secondary | ICD-10-CM | POA: Diagnosis not present

## 2020-07-11 DIAGNOSIS — J189 Pneumonia, unspecified organism: Secondary | ICD-10-CM | POA: Diagnosis not present

## 2020-07-11 DIAGNOSIS — Z7189 Other specified counseling: Secondary | ICD-10-CM | POA: Diagnosis not present

## 2020-07-11 DIAGNOSIS — R410 Disorientation, unspecified: Secondary | ICD-10-CM | POA: Diagnosis not present

## 2020-07-11 DIAGNOSIS — F0281 Dementia in other diseases classified elsewhere with behavioral disturbance: Secondary | ICD-10-CM | POA: Diagnosis not present

## 2020-07-11 DIAGNOSIS — M1990 Unspecified osteoarthritis, unspecified site: Secondary | ICD-10-CM | POA: Diagnosis not present

## 2020-07-11 DIAGNOSIS — F29 Unspecified psychosis not due to a substance or known physiological condition: Secondary | ICD-10-CM | POA: Diagnosis not present

## 2020-07-11 DIAGNOSIS — Z7401 Bed confinement status: Secondary | ICD-10-CM | POA: Diagnosis not present

## 2020-07-11 DIAGNOSIS — M6281 Muscle weakness (generalized): Secondary | ICD-10-CM | POA: Diagnosis not present

## 2020-07-11 DIAGNOSIS — Z0000 Encounter for general adult medical examination without abnormal findings: Secondary | ICD-10-CM | POA: Diagnosis not present

## 2020-07-11 DIAGNOSIS — E1165 Type 2 diabetes mellitus with hyperglycemia: Secondary | ICD-10-CM | POA: Diagnosis not present

## 2020-07-11 DIAGNOSIS — Z299 Encounter for prophylactic measures, unspecified: Secondary | ICD-10-CM | POA: Diagnosis not present

## 2020-07-11 DIAGNOSIS — I6381 Other cerebral infarction due to occlusion or stenosis of small artery: Secondary | ICD-10-CM | POA: Diagnosis not present

## 2020-07-11 DIAGNOSIS — G4700 Insomnia, unspecified: Secondary | ICD-10-CM | POA: Diagnosis not present

## 2020-07-11 DIAGNOSIS — R531 Weakness: Secondary | ICD-10-CM | POA: Diagnosis not present

## 2020-07-11 DIAGNOSIS — N390 Urinary tract infection, site not specified: Secondary | ICD-10-CM | POA: Diagnosis not present

## 2020-07-11 DIAGNOSIS — Z6837 Body mass index (BMI) 37.0-37.9, adult: Secondary | ICD-10-CM | POA: Diagnosis not present

## 2020-07-11 DIAGNOSIS — F0391 Unspecified dementia with behavioral disturbance: Secondary | ICD-10-CM | POA: Diagnosis not present

## 2020-07-12 DIAGNOSIS — F0390 Unspecified dementia without behavioral disturbance: Secondary | ICD-10-CM | POA: Diagnosis not present

## 2020-07-12 DIAGNOSIS — M810 Age-related osteoporosis without current pathological fracture: Secondary | ICD-10-CM | POA: Diagnosis not present

## 2020-07-12 DIAGNOSIS — E7800 Pure hypercholesterolemia, unspecified: Secondary | ICD-10-CM | POA: Diagnosis not present

## 2020-07-12 DIAGNOSIS — E1165 Type 2 diabetes mellitus with hyperglycemia: Secondary | ICD-10-CM | POA: Diagnosis not present

## 2020-09-01 DIAGNOSIS — R410 Disorientation, unspecified: Secondary | ICD-10-CM | POA: Diagnosis not present

## 2020-09-01 DIAGNOSIS — F0390 Unspecified dementia without behavioral disturbance: Secondary | ICD-10-CM | POA: Diagnosis not present

## 2020-09-01 DIAGNOSIS — Z299 Encounter for prophylactic measures, unspecified: Secondary | ICD-10-CM | POA: Diagnosis not present

## 2020-09-01 DIAGNOSIS — I10 Essential (primary) hypertension: Secondary | ICD-10-CM | POA: Diagnosis not present

## 2020-09-01 DIAGNOSIS — N1831 Chronic kidney disease, stage 3a: Secondary | ICD-10-CM | POA: Diagnosis not present

## 2020-09-01 DIAGNOSIS — E1165 Type 2 diabetes mellitus with hyperglycemia: Secondary | ICD-10-CM | POA: Diagnosis not present

## 2020-09-05 ENCOUNTER — Other Ambulatory Visit: Payer: Self-pay | Admitting: *Deleted

## 2020-09-05 NOTE — Patient Outreach (Signed)
Member screened for potential [facility] Care Management needs as a benefit of [place] Medicare.  Per Patient Victoria Christian member resides in [facility].   Communication sent to [facility] SNF SW to collaborate about anticipated dc plans and potential [facility] Care Management needs.  Will continue to follow while member resides in SNF.   Marthenia Rolling, MSN-Ed, RN,BSN [place] Acute Care Coordinator [phone] [facility]) [phone]  (Toll free office)

## 2020-09-06 ENCOUNTER — Other Ambulatory Visit: Payer: Self-pay | Admitting: *Deleted

## 2020-09-06 DIAGNOSIS — F0390 Unspecified dementia without behavioral disturbance: Secondary | ICD-10-CM | POA: Diagnosis not present

## 2020-09-06 DIAGNOSIS — Z299 Encounter for prophylactic measures, unspecified: Secondary | ICD-10-CM | POA: Diagnosis not present

## 2020-09-06 DIAGNOSIS — I6381 Other cerebral infarction due to occlusion or stenosis of small artery: Secondary | ICD-10-CM | POA: Diagnosis not present

## 2020-09-06 DIAGNOSIS — E1165 Type 2 diabetes mellitus with hyperglycemia: Secondary | ICD-10-CM | POA: Diagnosis not present

## 2020-09-06 DIAGNOSIS — N390 Urinary tract infection, site not specified: Secondary | ICD-10-CM | POA: Diagnosis not present

## 2020-09-06 NOTE — Patient Outreach (Signed)
[facility] Post- Acute Care Coordinator follow up. Member screened for potential [facility] Care Management needs as a benefit of [place] Medicare.  Update received from [place] indicating member's transition plan is LTC.   No identifiable [facility] Care Management needs at this time.    Marthenia Rolling, MSN-Ed, RN,BSN [place] Acute Care Coordinator [phone] [facility]) [phone]  (Toll free office)

## 2020-09-08 DIAGNOSIS — I6381 Other cerebral infarction due to occlusion or stenosis of small artery: Secondary | ICD-10-CM | POA: Diagnosis not present

## 2020-09-08 DIAGNOSIS — E1165 Type 2 diabetes mellitus with hyperglycemia: Secondary | ICD-10-CM | POA: Diagnosis not present

## 2020-09-08 DIAGNOSIS — F0390 Unspecified dementia without behavioral disturbance: Secondary | ICD-10-CM | POA: Diagnosis not present

## 2020-09-08 DIAGNOSIS — G4700 Insomnia, unspecified: Secondary | ICD-10-CM | POA: Diagnosis not present

## 2020-09-08 DIAGNOSIS — I10 Essential (primary) hypertension: Secondary | ICD-10-CM | POA: Diagnosis not present

## 2020-09-08 DIAGNOSIS — Z299 Encounter for prophylactic measures, unspecified: Secondary | ICD-10-CM | POA: Diagnosis not present

## 2020-09-15 DIAGNOSIS — F0391 Unspecified dementia with behavioral disturbance: Secondary | ICD-10-CM | POA: Diagnosis not present

## 2020-09-15 DIAGNOSIS — I10 Essential (primary) hypertension: Secondary | ICD-10-CM | POA: Diagnosis not present

## 2020-09-15 DIAGNOSIS — Z299 Encounter for prophylactic measures, unspecified: Secondary | ICD-10-CM | POA: Diagnosis not present

## 2020-09-15 DIAGNOSIS — G4700 Insomnia, unspecified: Secondary | ICD-10-CM | POA: Diagnosis not present

## 2020-09-15 DIAGNOSIS — I6381 Other cerebral infarction due to occlusion or stenosis of small artery: Secondary | ICD-10-CM | POA: Diagnosis not present

## 2020-09-26 ENCOUNTER — Other Ambulatory Visit: Payer: Self-pay | Admitting: *Deleted

## 2020-09-26 NOTE — Patient Outreach (Signed)
[facility] Post- Acute Care Coordinator follow up. Member screened for potential [facility] Care Management needs as a benefit of [place] Medicare.  Per Patient Pearletha Forge member resides in [facility].  Communication sent to [facility] SN[facility] SWs to request update on anticipated transition plans and potential [facility] Care Management needs.   Marthenia Rolling, MSN, RN,BSN [place] Acute Care Coordinator [phone] [facility]) [phone]  (Toll free office)

## 2020-09-28 ENCOUNTER — Other Ambulatory Visit: Payer: Self-pay | Admitting: *Deleted

## 2020-09-28 NOTE — Patient Outreach (Signed)
[place] Coordinator follow up. Member screened for potential [facility] Care Management needs as a benefit of [place] Medicare.  [facility] SW previously indicated member will transition to LTC.   No identifiable [facility] Care Management needs at this time.   Marthenia Rolling, MSN, RN,BSN [place] Acute Care Coordinator [phone] [facility]) [phone]  (Toll free office)

## 2020-10-11 DIAGNOSIS — E7800 Pure hypercholesterolemia, unspecified: Secondary | ICD-10-CM | POA: Diagnosis not present

## 2020-10-11 DIAGNOSIS — R5383 Other fatigue: Secondary | ICD-10-CM | POA: Diagnosis not present

## 2020-10-11 DIAGNOSIS — Z299 Encounter for prophylactic measures, unspecified: Secondary | ICD-10-CM | POA: Diagnosis not present

## 2020-10-11 DIAGNOSIS — Z6837 Body mass index (BMI) 37.0-37.9, adult: Secondary | ICD-10-CM | POA: Diagnosis not present

## 2020-10-11 DIAGNOSIS — E1165 Type 2 diabetes mellitus with hyperglycemia: Secondary | ICD-10-CM | POA: Diagnosis not present

## 2020-10-11 DIAGNOSIS — Z0000 Encounter for general adult medical examination without abnormal findings: Secondary | ICD-10-CM | POA: Diagnosis not present

## 2020-10-11 DIAGNOSIS — I10 Essential (primary) hypertension: Secondary | ICD-10-CM | POA: Diagnosis not present

## 2020-10-11 DIAGNOSIS — Z7189 Other specified counseling: Secondary | ICD-10-CM | POA: Diagnosis not present

## 2020-11-28 DIAGNOSIS — E1165 Type 2 diabetes mellitus with hyperglycemia: Secondary | ICD-10-CM | POA: Diagnosis not present

## 2020-11-28 DIAGNOSIS — I10 Essential (primary) hypertension: Secondary | ICD-10-CM | POA: Diagnosis not present

## 2020-11-28 DIAGNOSIS — E7800 Pure hypercholesterolemia, unspecified: Secondary | ICD-10-CM | POA: Diagnosis not present

## 2020-11-28 DIAGNOSIS — N1831 Chronic kidney disease, stage 3a: Secondary | ICD-10-CM | POA: Diagnosis not present

## 2020-12-06 DIAGNOSIS — Z299 Encounter for prophylactic measures, unspecified: Secondary | ICD-10-CM | POA: Diagnosis not present

## 2020-12-06 DIAGNOSIS — N1831 Chronic kidney disease, stage 3a: Secondary | ICD-10-CM | POA: Diagnosis not present

## 2020-12-06 DIAGNOSIS — F0391 Unspecified dementia with behavioral disturbance: Secondary | ICD-10-CM | POA: Diagnosis not present

## 2020-12-06 DIAGNOSIS — I6381 Other cerebral infarction due to occlusion or stenosis of small artery: Secondary | ICD-10-CM | POA: Diagnosis not present

## 2020-12-06 DIAGNOSIS — Z6837 Body mass index (BMI) 37.0-37.9, adult: Secondary | ICD-10-CM | POA: Diagnosis not present

## 2020-12-06 DIAGNOSIS — E1165 Type 2 diabetes mellitus with hyperglycemia: Secondary | ICD-10-CM | POA: Diagnosis not present

## 2020-12-20 DIAGNOSIS — E1165 Type 2 diabetes mellitus with hyperglycemia: Secondary | ICD-10-CM | POA: Diagnosis not present

## 2020-12-20 DIAGNOSIS — F0391 Unspecified dementia with behavioral disturbance: Secondary | ICD-10-CM | POA: Diagnosis not present

## 2020-12-20 DIAGNOSIS — Z299 Encounter for prophylactic measures, unspecified: Secondary | ICD-10-CM | POA: Diagnosis not present

## 2020-12-20 DIAGNOSIS — I10 Essential (primary) hypertension: Secondary | ICD-10-CM | POA: Diagnosis not present

## 2020-12-20 DIAGNOSIS — N1831 Chronic kidney disease, stage 3a: Secondary | ICD-10-CM | POA: Diagnosis not present

## 2020-12-26 DIAGNOSIS — F29 Unspecified psychosis not due to a substance or known physiological condition: Secondary | ICD-10-CM | POA: Diagnosis not present

## 2020-12-26 DIAGNOSIS — F0390 Unspecified dementia without behavioral disturbance: Secondary | ICD-10-CM | POA: Diagnosis not present

## 2020-12-26 DIAGNOSIS — E1165 Type 2 diabetes mellitus with hyperglycemia: Secondary | ICD-10-CM | POA: Diagnosis not present

## 2020-12-26 DIAGNOSIS — F419 Anxiety disorder, unspecified: Secondary | ICD-10-CM | POA: Diagnosis not present

## 2020-12-26 DIAGNOSIS — E7800 Pure hypercholesterolemia, unspecified: Secondary | ICD-10-CM | POA: Diagnosis not present

## 2020-12-26 DIAGNOSIS — G4700 Insomnia, unspecified: Secondary | ICD-10-CM | POA: Diagnosis not present

## 2020-12-26 DIAGNOSIS — N1831 Chronic kidney disease, stage 3a: Secondary | ICD-10-CM | POA: Diagnosis not present

## 2021-01-12 DIAGNOSIS — F0390 Unspecified dementia without behavioral disturbance: Secondary | ICD-10-CM | POA: Diagnosis not present

## 2021-01-12 DIAGNOSIS — F419 Anxiety disorder, unspecified: Secondary | ICD-10-CM | POA: Diagnosis not present

## 2021-01-12 DIAGNOSIS — G4700 Insomnia, unspecified: Secondary | ICD-10-CM | POA: Diagnosis not present

## 2021-01-12 DIAGNOSIS — F29 Unspecified psychosis not due to a substance or known physiological condition: Secondary | ICD-10-CM | POA: Diagnosis not present

## 2021-01-17 DIAGNOSIS — E538 Deficiency of other specified B group vitamins: Secondary | ICD-10-CM | POA: Diagnosis not present

## 2021-01-17 DIAGNOSIS — F0391 Unspecified dementia with behavioral disturbance: Secondary | ICD-10-CM | POA: Diagnosis not present

## 2021-01-17 DIAGNOSIS — E1165 Type 2 diabetes mellitus with hyperglycemia: Secondary | ICD-10-CM | POA: Diagnosis not present

## 2021-01-17 DIAGNOSIS — I6381 Other cerebral infarction due to occlusion or stenosis of small artery: Secondary | ICD-10-CM | POA: Diagnosis not present

## 2021-01-17 DIAGNOSIS — Z299 Encounter for prophylactic measures, unspecified: Secondary | ICD-10-CM | POA: Diagnosis not present

## 2021-01-26 DIAGNOSIS — E1140 Type 2 diabetes mellitus with diabetic neuropathy, unspecified: Secondary | ICD-10-CM | POA: Diagnosis not present

## 2021-01-26 DIAGNOSIS — L602 Onychogryphosis: Secondary | ICD-10-CM | POA: Diagnosis not present

## 2021-02-09 DIAGNOSIS — F419 Anxiety disorder, unspecified: Secondary | ICD-10-CM | POA: Diagnosis not present

## 2021-02-09 DIAGNOSIS — F29 Unspecified psychosis not due to a substance or known physiological condition: Secondary | ICD-10-CM | POA: Diagnosis not present

## 2021-02-09 DIAGNOSIS — F0390 Unspecified dementia without behavioral disturbance: Secondary | ICD-10-CM | POA: Diagnosis not present

## 2021-02-09 DIAGNOSIS — G4700 Insomnia, unspecified: Secondary | ICD-10-CM | POA: Diagnosis not present

## 2021-02-21 DIAGNOSIS — N1831 Chronic kidney disease, stage 3a: Secondary | ICD-10-CM | POA: Diagnosis not present

## 2021-02-21 DIAGNOSIS — I6381 Other cerebral infarction due to occlusion or stenosis of small artery: Secondary | ICD-10-CM | POA: Diagnosis not present

## 2021-02-21 DIAGNOSIS — E1165 Type 2 diabetes mellitus with hyperglycemia: Secondary | ICD-10-CM | POA: Diagnosis not present

## 2021-02-21 DIAGNOSIS — Z299 Encounter for prophylactic measures, unspecified: Secondary | ICD-10-CM | POA: Diagnosis not present

## 2021-02-21 DIAGNOSIS — F0391 Unspecified dementia with behavioral disturbance: Secondary | ICD-10-CM | POA: Diagnosis not present

## 2021-02-28 DIAGNOSIS — D692 Other nonthrombocytopenic purpura: Secondary | ICD-10-CM | POA: Diagnosis not present

## 2021-02-28 DIAGNOSIS — N390 Urinary tract infection, site not specified: Secondary | ICD-10-CM | POA: Diagnosis not present

## 2021-02-28 DIAGNOSIS — E1165 Type 2 diabetes mellitus with hyperglycemia: Secondary | ICD-10-CM | POA: Diagnosis not present

## 2021-02-28 DIAGNOSIS — F0391 Unspecified dementia with behavioral disturbance: Secondary | ICD-10-CM | POA: Diagnosis not present

## 2021-02-28 DIAGNOSIS — Z299 Encounter for prophylactic measures, unspecified: Secondary | ICD-10-CM | POA: Diagnosis not present

## 2021-03-03 DIAGNOSIS — R2689 Other abnormalities of gait and mobility: Secondary | ICD-10-CM | POA: Diagnosis not present

## 2021-03-03 DIAGNOSIS — N390 Urinary tract infection, site not specified: Secondary | ICD-10-CM | POA: Diagnosis not present

## 2021-03-03 DIAGNOSIS — M6281 Muscle weakness (generalized): Secondary | ICD-10-CM | POA: Diagnosis not present

## 2021-03-06 DIAGNOSIS — R2689 Other abnormalities of gait and mobility: Secondary | ICD-10-CM | POA: Diagnosis not present

## 2021-03-06 DIAGNOSIS — M6281 Muscle weakness (generalized): Secondary | ICD-10-CM | POA: Diagnosis not present

## 2021-03-06 DIAGNOSIS — N390 Urinary tract infection, site not specified: Secondary | ICD-10-CM | POA: Diagnosis not present

## 2021-03-07 DIAGNOSIS — F0391 Unspecified dementia with behavioral disturbance: Secondary | ICD-10-CM | POA: Diagnosis not present

## 2021-03-07 DIAGNOSIS — M6281 Muscle weakness (generalized): Secondary | ICD-10-CM | POA: Diagnosis not present

## 2021-03-07 DIAGNOSIS — I6381 Other cerebral infarction due to occlusion or stenosis of small artery: Secondary | ICD-10-CM | POA: Diagnosis not present

## 2021-03-07 DIAGNOSIS — N390 Urinary tract infection, site not specified: Secondary | ICD-10-CM | POA: Diagnosis not present

## 2021-03-07 DIAGNOSIS — N1831 Chronic kidney disease, stage 3a: Secondary | ICD-10-CM | POA: Diagnosis not present

## 2021-03-07 DIAGNOSIS — Z299 Encounter for prophylactic measures, unspecified: Secondary | ICD-10-CM | POA: Diagnosis not present

## 2021-03-07 DIAGNOSIS — R2689 Other abnormalities of gait and mobility: Secondary | ICD-10-CM | POA: Diagnosis not present

## 2021-03-08 DIAGNOSIS — N390 Urinary tract infection, site not specified: Secondary | ICD-10-CM | POA: Diagnosis not present

## 2021-03-08 DIAGNOSIS — R2689 Other abnormalities of gait and mobility: Secondary | ICD-10-CM | POA: Diagnosis not present

## 2021-03-08 DIAGNOSIS — M6281 Muscle weakness (generalized): Secondary | ICD-10-CM | POA: Diagnosis not present

## 2021-03-09 DIAGNOSIS — F419 Anxiety disorder, unspecified: Secondary | ICD-10-CM | POA: Diagnosis not present

## 2021-03-09 DIAGNOSIS — M6281 Muscle weakness (generalized): Secondary | ICD-10-CM | POA: Diagnosis not present

## 2021-03-09 DIAGNOSIS — F5101 Primary insomnia: Secondary | ICD-10-CM | POA: Diagnosis not present

## 2021-03-09 DIAGNOSIS — F0390 Unspecified dementia without behavioral disturbance: Secondary | ICD-10-CM | POA: Diagnosis not present

## 2021-03-09 DIAGNOSIS — F29 Unspecified psychosis not due to a substance or known physiological condition: Secondary | ICD-10-CM | POA: Diagnosis not present

## 2021-03-09 DIAGNOSIS — R2689 Other abnormalities of gait and mobility: Secondary | ICD-10-CM | POA: Diagnosis not present

## 2021-03-09 DIAGNOSIS — N390 Urinary tract infection, site not specified: Secondary | ICD-10-CM | POA: Diagnosis not present

## 2021-03-13 DIAGNOSIS — N390 Urinary tract infection, site not specified: Secondary | ICD-10-CM | POA: Diagnosis not present

## 2021-03-13 DIAGNOSIS — R2689 Other abnormalities of gait and mobility: Secondary | ICD-10-CM | POA: Diagnosis not present

## 2021-03-13 DIAGNOSIS — M6281 Muscle weakness (generalized): Secondary | ICD-10-CM | POA: Diagnosis not present

## 2021-03-14 DIAGNOSIS — R2689 Other abnormalities of gait and mobility: Secondary | ICD-10-CM | POA: Diagnosis not present

## 2021-03-14 DIAGNOSIS — M6281 Muscle weakness (generalized): Secondary | ICD-10-CM | POA: Diagnosis not present

## 2021-03-14 DIAGNOSIS — N390 Urinary tract infection, site not specified: Secondary | ICD-10-CM | POA: Diagnosis not present

## 2021-03-16 DIAGNOSIS — N390 Urinary tract infection, site not specified: Secondary | ICD-10-CM | POA: Diagnosis not present

## 2021-03-16 DIAGNOSIS — R2689 Other abnormalities of gait and mobility: Secondary | ICD-10-CM | POA: Diagnosis not present

## 2021-03-16 DIAGNOSIS — M6281 Muscle weakness (generalized): Secondary | ICD-10-CM | POA: Diagnosis not present

## 2021-03-20 DIAGNOSIS — N390 Urinary tract infection, site not specified: Secondary | ICD-10-CM | POA: Diagnosis not present

## 2021-03-20 DIAGNOSIS — M6281 Muscle weakness (generalized): Secondary | ICD-10-CM | POA: Diagnosis not present

## 2021-03-20 DIAGNOSIS — R2689 Other abnormalities of gait and mobility: Secondary | ICD-10-CM | POA: Diagnosis not present

## 2021-03-21 DIAGNOSIS — N390 Urinary tract infection, site not specified: Secondary | ICD-10-CM | POA: Diagnosis not present

## 2021-03-21 DIAGNOSIS — M6281 Muscle weakness (generalized): Secondary | ICD-10-CM | POA: Diagnosis not present

## 2021-03-21 DIAGNOSIS — R2689 Other abnormalities of gait and mobility: Secondary | ICD-10-CM | POA: Diagnosis not present

## 2021-03-23 DIAGNOSIS — N390 Urinary tract infection, site not specified: Secondary | ICD-10-CM | POA: Diagnosis not present

## 2021-03-23 DIAGNOSIS — M6281 Muscle weakness (generalized): Secondary | ICD-10-CM | POA: Diagnosis not present

## 2021-03-23 DIAGNOSIS — R2689 Other abnormalities of gait and mobility: Secondary | ICD-10-CM | POA: Diagnosis not present

## 2021-03-24 DIAGNOSIS — N390 Urinary tract infection, site not specified: Secondary | ICD-10-CM | POA: Diagnosis not present

## 2021-03-24 DIAGNOSIS — R2689 Other abnormalities of gait and mobility: Secondary | ICD-10-CM | POA: Diagnosis not present

## 2021-03-24 DIAGNOSIS — M6281 Muscle weakness (generalized): Secondary | ICD-10-CM | POA: Diagnosis not present

## 2021-03-27 DIAGNOSIS — R2689 Other abnormalities of gait and mobility: Secondary | ICD-10-CM | POA: Diagnosis not present

## 2021-03-27 DIAGNOSIS — N1831 Chronic kidney disease, stage 3a: Secondary | ICD-10-CM | POA: Diagnosis not present

## 2021-03-27 DIAGNOSIS — E7800 Pure hypercholesterolemia, unspecified: Secondary | ICD-10-CM | POA: Diagnosis not present

## 2021-03-27 DIAGNOSIS — E1165 Type 2 diabetes mellitus with hyperglycemia: Secondary | ICD-10-CM | POA: Diagnosis not present

## 2021-03-27 DIAGNOSIS — M6281 Muscle weakness (generalized): Secondary | ICD-10-CM | POA: Diagnosis not present

## 2021-03-27 DIAGNOSIS — N390 Urinary tract infection, site not specified: Secondary | ICD-10-CM | POA: Diagnosis not present

## 2021-03-28 DIAGNOSIS — N390 Urinary tract infection, site not specified: Secondary | ICD-10-CM | POA: Diagnosis not present

## 2021-03-28 DIAGNOSIS — R2689 Other abnormalities of gait and mobility: Secondary | ICD-10-CM | POA: Diagnosis not present

## 2021-03-28 DIAGNOSIS — M6281 Muscle weakness (generalized): Secondary | ICD-10-CM | POA: Diagnosis not present

## 2021-03-29 DIAGNOSIS — R2689 Other abnormalities of gait and mobility: Secondary | ICD-10-CM | POA: Diagnosis not present

## 2021-03-29 DIAGNOSIS — M6281 Muscle weakness (generalized): Secondary | ICD-10-CM | POA: Diagnosis not present

## 2021-03-29 DIAGNOSIS — N390 Urinary tract infection, site not specified: Secondary | ICD-10-CM | POA: Diagnosis not present

## 2021-03-30 DIAGNOSIS — R2689 Other abnormalities of gait and mobility: Secondary | ICD-10-CM | POA: Diagnosis not present

## 2021-03-30 DIAGNOSIS — M6281 Muscle weakness (generalized): Secondary | ICD-10-CM | POA: Diagnosis not present

## 2021-03-30 DIAGNOSIS — N390 Urinary tract infection, site not specified: Secondary | ICD-10-CM | POA: Diagnosis not present

## 2021-03-31 DIAGNOSIS — N390 Urinary tract infection, site not specified: Secondary | ICD-10-CM | POA: Diagnosis not present

## 2021-03-31 DIAGNOSIS — M6281 Muscle weakness (generalized): Secondary | ICD-10-CM | POA: Diagnosis not present

## 2021-03-31 DIAGNOSIS — R2689 Other abnormalities of gait and mobility: Secondary | ICD-10-CM | POA: Diagnosis not present

## 2021-04-03 DIAGNOSIS — M6281 Muscle weakness (generalized): Secondary | ICD-10-CM | POA: Diagnosis not present

## 2021-04-03 DIAGNOSIS — R2689 Other abnormalities of gait and mobility: Secondary | ICD-10-CM | POA: Diagnosis not present

## 2021-04-03 DIAGNOSIS — N390 Urinary tract infection, site not specified: Secondary | ICD-10-CM | POA: Diagnosis not present

## 2021-04-04 DIAGNOSIS — M6281 Muscle weakness (generalized): Secondary | ICD-10-CM | POA: Diagnosis not present

## 2021-04-04 DIAGNOSIS — N390 Urinary tract infection, site not specified: Secondary | ICD-10-CM | POA: Diagnosis not present

## 2021-04-04 DIAGNOSIS — R2689 Other abnormalities of gait and mobility: Secondary | ICD-10-CM | POA: Diagnosis not present

## 2021-04-05 DIAGNOSIS — N390 Urinary tract infection, site not specified: Secondary | ICD-10-CM | POA: Diagnosis not present

## 2021-04-05 DIAGNOSIS — R2689 Other abnormalities of gait and mobility: Secondary | ICD-10-CM | POA: Diagnosis not present

## 2021-04-05 DIAGNOSIS — M6281 Muscle weakness (generalized): Secondary | ICD-10-CM | POA: Diagnosis not present

## 2021-04-06 DIAGNOSIS — N390 Urinary tract infection, site not specified: Secondary | ICD-10-CM | POA: Diagnosis not present

## 2021-04-06 DIAGNOSIS — M6281 Muscle weakness (generalized): Secondary | ICD-10-CM | POA: Diagnosis not present

## 2021-04-06 DIAGNOSIS — R2689 Other abnormalities of gait and mobility: Secondary | ICD-10-CM | POA: Diagnosis not present

## 2021-04-07 DIAGNOSIS — R2689 Other abnormalities of gait and mobility: Secondary | ICD-10-CM | POA: Diagnosis not present

## 2021-04-07 DIAGNOSIS — M6281 Muscle weakness (generalized): Secondary | ICD-10-CM | POA: Diagnosis not present

## 2021-04-07 DIAGNOSIS — N390 Urinary tract infection, site not specified: Secondary | ICD-10-CM | POA: Diagnosis not present

## 2021-04-10 DIAGNOSIS — N390 Urinary tract infection, site not specified: Secondary | ICD-10-CM | POA: Diagnosis not present

## 2021-04-10 DIAGNOSIS — R2689 Other abnormalities of gait and mobility: Secondary | ICD-10-CM | POA: Diagnosis not present

## 2021-04-10 DIAGNOSIS — M6281 Muscle weakness (generalized): Secondary | ICD-10-CM | POA: Diagnosis not present

## 2021-04-11 DIAGNOSIS — Z299 Encounter for prophylactic measures, unspecified: Secondary | ICD-10-CM | POA: Diagnosis not present

## 2021-04-11 DIAGNOSIS — R2689 Other abnormalities of gait and mobility: Secondary | ICD-10-CM | POA: Diagnosis not present

## 2021-04-11 DIAGNOSIS — N1831 Chronic kidney disease, stage 3a: Secondary | ICD-10-CM | POA: Diagnosis not present

## 2021-04-11 DIAGNOSIS — I6381 Other cerebral infarction due to occlusion or stenosis of small artery: Secondary | ICD-10-CM | POA: Diagnosis not present

## 2021-04-11 DIAGNOSIS — F0390 Unspecified dementia without behavioral disturbance: Secondary | ICD-10-CM | POA: Diagnosis not present

## 2021-04-11 DIAGNOSIS — F0391 Unspecified dementia with behavioral disturbance: Secondary | ICD-10-CM | POA: Diagnosis not present

## 2021-04-11 DIAGNOSIS — N390 Urinary tract infection, site not specified: Secondary | ICD-10-CM | POA: Diagnosis not present

## 2021-04-11 DIAGNOSIS — M6281 Muscle weakness (generalized): Secondary | ICD-10-CM | POA: Diagnosis not present

## 2021-04-12 DIAGNOSIS — N390 Urinary tract infection, site not specified: Secondary | ICD-10-CM | POA: Diagnosis not present

## 2021-04-12 DIAGNOSIS — M6281 Muscle weakness (generalized): Secondary | ICD-10-CM | POA: Diagnosis not present

## 2021-04-12 DIAGNOSIS — R2689 Other abnormalities of gait and mobility: Secondary | ICD-10-CM | POA: Diagnosis not present

## 2021-04-13 DIAGNOSIS — F0390 Unspecified dementia without behavioral disturbance: Secondary | ICD-10-CM | POA: Diagnosis not present

## 2021-04-13 DIAGNOSIS — F29 Unspecified psychosis not due to a substance or known physiological condition: Secondary | ICD-10-CM | POA: Diagnosis not present

## 2021-04-13 DIAGNOSIS — F5101 Primary insomnia: Secondary | ICD-10-CM | POA: Diagnosis not present

## 2021-04-14 DIAGNOSIS — R2689 Other abnormalities of gait and mobility: Secondary | ICD-10-CM | POA: Diagnosis not present

## 2021-04-14 DIAGNOSIS — N390 Urinary tract infection, site not specified: Secondary | ICD-10-CM | POA: Diagnosis not present

## 2021-04-14 DIAGNOSIS — M6281 Muscle weakness (generalized): Secondary | ICD-10-CM | POA: Diagnosis not present

## 2021-04-19 DIAGNOSIS — N390 Urinary tract infection, site not specified: Secondary | ICD-10-CM | POA: Diagnosis not present

## 2021-04-19 DIAGNOSIS — R2689 Other abnormalities of gait and mobility: Secondary | ICD-10-CM | POA: Diagnosis not present

## 2021-04-19 DIAGNOSIS — M6281 Muscle weakness (generalized): Secondary | ICD-10-CM | POA: Diagnosis not present

## 2021-04-22 DIAGNOSIS — M6281 Muscle weakness (generalized): Secondary | ICD-10-CM | POA: Diagnosis not present

## 2021-04-22 DIAGNOSIS — R2689 Other abnormalities of gait and mobility: Secondary | ICD-10-CM | POA: Diagnosis not present

## 2021-04-22 DIAGNOSIS — N390 Urinary tract infection, site not specified: Secondary | ICD-10-CM | POA: Diagnosis not present

## 2021-04-26 DIAGNOSIS — M6281 Muscle weakness (generalized): Secondary | ICD-10-CM | POA: Diagnosis not present

## 2021-04-26 DIAGNOSIS — R2689 Other abnormalities of gait and mobility: Secondary | ICD-10-CM | POA: Diagnosis not present

## 2021-04-26 DIAGNOSIS — N390 Urinary tract infection, site not specified: Secondary | ICD-10-CM | POA: Diagnosis not present

## 2021-04-27 DIAGNOSIS — R2689 Other abnormalities of gait and mobility: Secondary | ICD-10-CM | POA: Diagnosis not present

## 2021-04-27 DIAGNOSIS — N390 Urinary tract infection, site not specified: Secondary | ICD-10-CM | POA: Diagnosis not present

## 2021-04-27 DIAGNOSIS — M6281 Muscle weakness (generalized): Secondary | ICD-10-CM | POA: Diagnosis not present

## 2021-04-28 DIAGNOSIS — M6281 Muscle weakness (generalized): Secondary | ICD-10-CM | POA: Diagnosis not present

## 2021-04-28 DIAGNOSIS — N390 Urinary tract infection, site not specified: Secondary | ICD-10-CM | POA: Diagnosis not present

## 2021-04-28 DIAGNOSIS — R2689 Other abnormalities of gait and mobility: Secondary | ICD-10-CM | POA: Diagnosis not present

## 2021-04-29 DIAGNOSIS — R2689 Other abnormalities of gait and mobility: Secondary | ICD-10-CM | POA: Diagnosis not present

## 2021-04-29 DIAGNOSIS — N390 Urinary tract infection, site not specified: Secondary | ICD-10-CM | POA: Diagnosis not present

## 2021-04-29 DIAGNOSIS — M6281 Muscle weakness (generalized): Secondary | ICD-10-CM | POA: Diagnosis not present

## 2021-05-02 DIAGNOSIS — M6281 Muscle weakness (generalized): Secondary | ICD-10-CM | POA: Diagnosis not present

## 2021-05-02 DIAGNOSIS — R2689 Other abnormalities of gait and mobility: Secondary | ICD-10-CM | POA: Diagnosis not present

## 2021-05-02 DIAGNOSIS — N390 Urinary tract infection, site not specified: Secondary | ICD-10-CM | POA: Diagnosis not present

## 2021-05-05 DIAGNOSIS — N390 Urinary tract infection, site not specified: Secondary | ICD-10-CM | POA: Diagnosis not present

## 2021-05-05 DIAGNOSIS — M6281 Muscle weakness (generalized): Secondary | ICD-10-CM | POA: Diagnosis not present

## 2021-05-05 DIAGNOSIS — R2689 Other abnormalities of gait and mobility: Secondary | ICD-10-CM | POA: Diagnosis not present

## 2021-05-16 DIAGNOSIS — I6381 Other cerebral infarction due to occlusion or stenosis of small artery: Secondary | ICD-10-CM | POA: Diagnosis not present

## 2021-05-16 DIAGNOSIS — E1165 Type 2 diabetes mellitus with hyperglycemia: Secondary | ICD-10-CM | POA: Diagnosis not present

## 2021-05-16 DIAGNOSIS — Z299 Encounter for prophylactic measures, unspecified: Secondary | ICD-10-CM | POA: Diagnosis not present

## 2021-05-16 DIAGNOSIS — F0391 Unspecified dementia with behavioral disturbance: Secondary | ICD-10-CM | POA: Diagnosis not present

## 2021-05-16 DIAGNOSIS — N1831 Chronic kidney disease, stage 3a: Secondary | ICD-10-CM | POA: Diagnosis not present

## 2021-05-17 DIAGNOSIS — Z23 Encounter for immunization: Secondary | ICD-10-CM | POA: Diagnosis not present

## 2021-05-18 DIAGNOSIS — F0391 Unspecified dementia with behavioral disturbance: Secondary | ICD-10-CM | POA: Diagnosis not present

## 2021-05-18 DIAGNOSIS — I10 Essential (primary) hypertension: Secondary | ICD-10-CM | POA: Diagnosis not present

## 2021-05-18 DIAGNOSIS — R532 Functional quadriplegia: Secondary | ICD-10-CM | POA: Diagnosis not present

## 2021-05-18 DIAGNOSIS — F0390 Unspecified dementia without behavioral disturbance: Secondary | ICD-10-CM | POA: Diagnosis not present

## 2021-05-18 DIAGNOSIS — F29 Unspecified psychosis not due to a substance or known physiological condition: Secondary | ICD-10-CM | POA: Diagnosis not present

## 2021-05-18 DIAGNOSIS — E1165 Type 2 diabetes mellitus with hyperglycemia: Secondary | ICD-10-CM | POA: Diagnosis not present

## 2021-05-18 DIAGNOSIS — Z299 Encounter for prophylactic measures, unspecified: Secondary | ICD-10-CM | POA: Diagnosis not present

## 2021-05-18 DIAGNOSIS — F5101 Primary insomnia: Secondary | ICD-10-CM | POA: Diagnosis not present

## 2021-05-18 DIAGNOSIS — R5383 Other fatigue: Secondary | ICD-10-CM | POA: Diagnosis not present

## 2021-05-23 DIAGNOSIS — I10 Essential (primary) hypertension: Secondary | ICD-10-CM | POA: Diagnosis not present

## 2021-05-23 DIAGNOSIS — Z299 Encounter for prophylactic measures, unspecified: Secondary | ICD-10-CM | POA: Diagnosis not present

## 2021-05-23 DIAGNOSIS — B373 Candidiasis of vulva and vagina: Secondary | ICD-10-CM | POA: Diagnosis not present

## 2021-06-13 DIAGNOSIS — R2689 Other abnormalities of gait and mobility: Secondary | ICD-10-CM | POA: Diagnosis not present

## 2021-06-13 DIAGNOSIS — G309 Alzheimer's disease, unspecified: Secondary | ICD-10-CM | POA: Diagnosis not present

## 2021-06-13 DIAGNOSIS — R1312 Dysphagia, oropharyngeal phase: Secondary | ICD-10-CM | POA: Diagnosis not present

## 2021-06-13 DIAGNOSIS — R41841 Cognitive communication deficit: Secondary | ICD-10-CM | POA: Diagnosis not present

## 2021-06-13 DIAGNOSIS — M6281 Muscle weakness (generalized): Secondary | ICD-10-CM | POA: Diagnosis not present

## 2021-06-14 DIAGNOSIS — R1312 Dysphagia, oropharyngeal phase: Secondary | ICD-10-CM | POA: Diagnosis not present

## 2021-06-14 DIAGNOSIS — R2689 Other abnormalities of gait and mobility: Secondary | ICD-10-CM | POA: Diagnosis not present

## 2021-06-14 DIAGNOSIS — M6281 Muscle weakness (generalized): Secondary | ICD-10-CM | POA: Diagnosis not present

## 2021-06-14 DIAGNOSIS — G309 Alzheimer's disease, unspecified: Secondary | ICD-10-CM | POA: Diagnosis not present

## 2021-06-14 DIAGNOSIS — R41841 Cognitive communication deficit: Secondary | ICD-10-CM | POA: Diagnosis not present

## 2021-06-19 DIAGNOSIS — R41841 Cognitive communication deficit: Secondary | ICD-10-CM | POA: Diagnosis not present

## 2021-06-19 DIAGNOSIS — G309 Alzheimer's disease, unspecified: Secondary | ICD-10-CM | POA: Diagnosis not present

## 2021-06-19 DIAGNOSIS — R1312 Dysphagia, oropharyngeal phase: Secondary | ICD-10-CM | POA: Diagnosis not present

## 2021-06-19 DIAGNOSIS — R2689 Other abnormalities of gait and mobility: Secondary | ICD-10-CM | POA: Diagnosis not present

## 2021-06-19 DIAGNOSIS — M6281 Muscle weakness (generalized): Secondary | ICD-10-CM | POA: Diagnosis not present

## 2021-06-20 DIAGNOSIS — R41841 Cognitive communication deficit: Secondary | ICD-10-CM | POA: Diagnosis not present

## 2021-06-20 DIAGNOSIS — M6281 Muscle weakness (generalized): Secondary | ICD-10-CM | POA: Diagnosis not present

## 2021-06-20 DIAGNOSIS — R2689 Other abnormalities of gait and mobility: Secondary | ICD-10-CM | POA: Diagnosis not present

## 2021-06-20 DIAGNOSIS — G309 Alzheimer's disease, unspecified: Secondary | ICD-10-CM | POA: Diagnosis not present

## 2021-06-20 DIAGNOSIS — R1312 Dysphagia, oropharyngeal phase: Secondary | ICD-10-CM | POA: Diagnosis not present

## 2021-06-21 DIAGNOSIS — R1312 Dysphagia, oropharyngeal phase: Secondary | ICD-10-CM | POA: Diagnosis not present

## 2021-06-21 DIAGNOSIS — R41841 Cognitive communication deficit: Secondary | ICD-10-CM | POA: Diagnosis not present

## 2021-06-21 DIAGNOSIS — G309 Alzheimer's disease, unspecified: Secondary | ICD-10-CM | POA: Diagnosis not present

## 2021-06-21 DIAGNOSIS — R2689 Other abnormalities of gait and mobility: Secondary | ICD-10-CM | POA: Diagnosis not present

## 2021-06-21 DIAGNOSIS — M6281 Muscle weakness (generalized): Secondary | ICD-10-CM | POA: Diagnosis not present

## 2021-06-22 DIAGNOSIS — G309 Alzheimer's disease, unspecified: Secondary | ICD-10-CM | POA: Diagnosis not present

## 2021-06-22 DIAGNOSIS — R41841 Cognitive communication deficit: Secondary | ICD-10-CM | POA: Diagnosis not present

## 2021-06-22 DIAGNOSIS — R2689 Other abnormalities of gait and mobility: Secondary | ICD-10-CM | POA: Diagnosis not present

## 2021-06-22 DIAGNOSIS — M6281 Muscle weakness (generalized): Secondary | ICD-10-CM | POA: Diagnosis not present

## 2021-06-22 DIAGNOSIS — R1312 Dysphagia, oropharyngeal phase: Secondary | ICD-10-CM | POA: Diagnosis not present

## 2021-06-23 DIAGNOSIS — R1312 Dysphagia, oropharyngeal phase: Secondary | ICD-10-CM | POA: Diagnosis not present

## 2021-06-23 DIAGNOSIS — R41841 Cognitive communication deficit: Secondary | ICD-10-CM | POA: Diagnosis not present

## 2021-06-23 DIAGNOSIS — G309 Alzheimer's disease, unspecified: Secondary | ICD-10-CM | POA: Diagnosis not present

## 2021-06-23 DIAGNOSIS — R2689 Other abnormalities of gait and mobility: Secondary | ICD-10-CM | POA: Diagnosis not present

## 2021-06-23 DIAGNOSIS — M6281 Muscle weakness (generalized): Secondary | ICD-10-CM | POA: Diagnosis not present

## 2021-06-26 DIAGNOSIS — R1312 Dysphagia, oropharyngeal phase: Secondary | ICD-10-CM | POA: Diagnosis not present

## 2021-06-26 DIAGNOSIS — R41841 Cognitive communication deficit: Secondary | ICD-10-CM | POA: Diagnosis not present

## 2021-06-26 DIAGNOSIS — M6281 Muscle weakness (generalized): Secondary | ICD-10-CM | POA: Diagnosis not present

## 2021-06-26 DIAGNOSIS — G309 Alzheimer's disease, unspecified: Secondary | ICD-10-CM | POA: Diagnosis not present

## 2021-06-26 DIAGNOSIS — R2689 Other abnormalities of gait and mobility: Secondary | ICD-10-CM | POA: Diagnosis not present

## 2021-06-27 DIAGNOSIS — G309 Alzheimer's disease, unspecified: Secondary | ICD-10-CM | POA: Diagnosis not present

## 2021-06-27 DIAGNOSIS — R2689 Other abnormalities of gait and mobility: Secondary | ICD-10-CM | POA: Diagnosis not present

## 2021-06-27 DIAGNOSIS — E1151 Type 2 diabetes mellitus with diabetic peripheral angiopathy without gangrene: Secondary | ICD-10-CM | POA: Diagnosis not present

## 2021-06-27 DIAGNOSIS — R41841 Cognitive communication deficit: Secondary | ICD-10-CM | POA: Diagnosis not present

## 2021-06-27 DIAGNOSIS — D692 Other nonthrombocytopenic purpura: Secondary | ICD-10-CM | POA: Diagnosis not present

## 2021-06-27 DIAGNOSIS — Z299 Encounter for prophylactic measures, unspecified: Secondary | ICD-10-CM | POA: Diagnosis not present

## 2021-06-27 DIAGNOSIS — L602 Onychogryphosis: Secondary | ICD-10-CM | POA: Diagnosis not present

## 2021-06-27 DIAGNOSIS — R1312 Dysphagia, oropharyngeal phase: Secondary | ICD-10-CM | POA: Diagnosis not present

## 2021-06-27 DIAGNOSIS — L853 Xerosis cutis: Secondary | ICD-10-CM | POA: Diagnosis not present

## 2021-06-27 DIAGNOSIS — N1831 Chronic kidney disease, stage 3a: Secondary | ICD-10-CM | POA: Diagnosis not present

## 2021-06-27 DIAGNOSIS — L603 Nail dystrophy: Secondary | ICD-10-CM | POA: Diagnosis not present

## 2021-06-27 DIAGNOSIS — F0390 Unspecified dementia without behavioral disturbance: Secondary | ICD-10-CM | POA: Diagnosis not present

## 2021-06-27 DIAGNOSIS — M6281 Muscle weakness (generalized): Secondary | ICD-10-CM | POA: Diagnosis not present

## 2021-06-28 DIAGNOSIS — R1312 Dysphagia, oropharyngeal phase: Secondary | ICD-10-CM | POA: Diagnosis not present

## 2021-06-28 DIAGNOSIS — R41841 Cognitive communication deficit: Secondary | ICD-10-CM | POA: Diagnosis not present

## 2021-06-28 DIAGNOSIS — G309 Alzheimer's disease, unspecified: Secondary | ICD-10-CM | POA: Diagnosis not present

## 2021-06-28 DIAGNOSIS — M6281 Muscle weakness (generalized): Secondary | ICD-10-CM | POA: Diagnosis not present

## 2021-06-28 DIAGNOSIS — R2689 Other abnormalities of gait and mobility: Secondary | ICD-10-CM | POA: Diagnosis not present

## 2021-06-29 DIAGNOSIS — R41841 Cognitive communication deficit: Secondary | ICD-10-CM | POA: Diagnosis not present

## 2021-06-29 DIAGNOSIS — R2689 Other abnormalities of gait and mobility: Secondary | ICD-10-CM | POA: Diagnosis not present

## 2021-06-29 DIAGNOSIS — M6281 Muscle weakness (generalized): Secondary | ICD-10-CM | POA: Diagnosis not present

## 2021-06-29 DIAGNOSIS — R1312 Dysphagia, oropharyngeal phase: Secondary | ICD-10-CM | POA: Diagnosis not present

## 2021-06-29 DIAGNOSIS — G309 Alzheimer's disease, unspecified: Secondary | ICD-10-CM | POA: Diagnosis not present

## 2021-06-30 DIAGNOSIS — M6281 Muscle weakness (generalized): Secondary | ICD-10-CM | POA: Diagnosis not present

## 2021-06-30 DIAGNOSIS — R2689 Other abnormalities of gait and mobility: Secondary | ICD-10-CM | POA: Diagnosis not present

## 2021-06-30 DIAGNOSIS — G309 Alzheimer's disease, unspecified: Secondary | ICD-10-CM | POA: Diagnosis not present

## 2021-06-30 DIAGNOSIS — R1312 Dysphagia, oropharyngeal phase: Secondary | ICD-10-CM | POA: Diagnosis not present

## 2021-06-30 DIAGNOSIS — R41841 Cognitive communication deficit: Secondary | ICD-10-CM | POA: Diagnosis not present

## 2021-07-03 DIAGNOSIS — R41841 Cognitive communication deficit: Secondary | ICD-10-CM | POA: Diagnosis not present

## 2021-07-03 DIAGNOSIS — G309 Alzheimer's disease, unspecified: Secondary | ICD-10-CM | POA: Diagnosis not present

## 2021-07-03 DIAGNOSIS — M6281 Muscle weakness (generalized): Secondary | ICD-10-CM | POA: Diagnosis not present

## 2021-07-03 DIAGNOSIS — R1312 Dysphagia, oropharyngeal phase: Secondary | ICD-10-CM | POA: Diagnosis not present

## 2021-07-03 DIAGNOSIS — R2689 Other abnormalities of gait and mobility: Secondary | ICD-10-CM | POA: Diagnosis not present

## 2021-07-04 DIAGNOSIS — M6281 Muscle weakness (generalized): Secondary | ICD-10-CM | POA: Diagnosis not present

## 2021-07-04 DIAGNOSIS — R2689 Other abnormalities of gait and mobility: Secondary | ICD-10-CM | POA: Diagnosis not present

## 2021-07-04 DIAGNOSIS — R41841 Cognitive communication deficit: Secondary | ICD-10-CM | POA: Diagnosis not present

## 2021-07-04 DIAGNOSIS — G309 Alzheimer's disease, unspecified: Secondary | ICD-10-CM | POA: Diagnosis not present

## 2021-07-04 DIAGNOSIS — R1312 Dysphagia, oropharyngeal phase: Secondary | ICD-10-CM | POA: Diagnosis not present

## 2021-07-05 DIAGNOSIS — G309 Alzheimer's disease, unspecified: Secondary | ICD-10-CM | POA: Diagnosis not present

## 2021-07-05 DIAGNOSIS — M6281 Muscle weakness (generalized): Secondary | ICD-10-CM | POA: Diagnosis not present

## 2021-07-05 DIAGNOSIS — R1312 Dysphagia, oropharyngeal phase: Secondary | ICD-10-CM | POA: Diagnosis not present

## 2021-07-05 DIAGNOSIS — R41841 Cognitive communication deficit: Secondary | ICD-10-CM | POA: Diagnosis not present

## 2021-07-05 DIAGNOSIS — R2689 Other abnormalities of gait and mobility: Secondary | ICD-10-CM | POA: Diagnosis not present

## 2021-07-06 DIAGNOSIS — R41841 Cognitive communication deficit: Secondary | ICD-10-CM | POA: Diagnosis not present

## 2021-07-06 DIAGNOSIS — R1312 Dysphagia, oropharyngeal phase: Secondary | ICD-10-CM | POA: Diagnosis not present

## 2021-07-06 DIAGNOSIS — M6281 Muscle weakness (generalized): Secondary | ICD-10-CM | POA: Diagnosis not present

## 2021-07-06 DIAGNOSIS — G309 Alzheimer's disease, unspecified: Secondary | ICD-10-CM | POA: Diagnosis not present

## 2021-07-06 DIAGNOSIS — R2689 Other abnormalities of gait and mobility: Secondary | ICD-10-CM | POA: Diagnosis not present

## 2021-07-08 DIAGNOSIS — R1312 Dysphagia, oropharyngeal phase: Secondary | ICD-10-CM | POA: Diagnosis not present

## 2021-07-08 DIAGNOSIS — G309 Alzheimer's disease, unspecified: Secondary | ICD-10-CM | POA: Diagnosis not present

## 2021-07-08 DIAGNOSIS — M6281 Muscle weakness (generalized): Secondary | ICD-10-CM | POA: Diagnosis not present

## 2021-07-08 DIAGNOSIS — R41841 Cognitive communication deficit: Secondary | ICD-10-CM | POA: Diagnosis not present

## 2021-07-08 DIAGNOSIS — R2689 Other abnormalities of gait and mobility: Secondary | ICD-10-CM | POA: Diagnosis not present

## 2021-07-11 DIAGNOSIS — R1312 Dysphagia, oropharyngeal phase: Secondary | ICD-10-CM | POA: Diagnosis not present

## 2021-07-11 DIAGNOSIS — R41841 Cognitive communication deficit: Secondary | ICD-10-CM | POA: Diagnosis not present

## 2021-07-11 DIAGNOSIS — R2689 Other abnormalities of gait and mobility: Secondary | ICD-10-CM | POA: Diagnosis not present

## 2021-07-11 DIAGNOSIS — H109 Unspecified conjunctivitis: Secondary | ICD-10-CM | POA: Diagnosis not present

## 2021-07-11 DIAGNOSIS — M6281 Muscle weakness (generalized): Secondary | ICD-10-CM | POA: Diagnosis not present

## 2021-07-11 DIAGNOSIS — G309 Alzheimer's disease, unspecified: Secondary | ICD-10-CM | POA: Diagnosis not present

## 2021-07-11 DIAGNOSIS — H579 Unspecified disorder of eye and adnexa: Secondary | ICD-10-CM | POA: Diagnosis not present

## 2021-07-11 DIAGNOSIS — Z299 Encounter for prophylactic measures, unspecified: Secondary | ICD-10-CM | POA: Diagnosis not present

## 2021-07-12 DIAGNOSIS — R41841 Cognitive communication deficit: Secondary | ICD-10-CM | POA: Diagnosis not present

## 2021-07-12 DIAGNOSIS — M6281 Muscle weakness (generalized): Secondary | ICD-10-CM | POA: Diagnosis not present

## 2021-07-12 DIAGNOSIS — R1312 Dysphagia, oropharyngeal phase: Secondary | ICD-10-CM | POA: Diagnosis not present

## 2021-07-12 DIAGNOSIS — R2689 Other abnormalities of gait and mobility: Secondary | ICD-10-CM | POA: Diagnosis not present

## 2021-07-12 DIAGNOSIS — G309 Alzheimer's disease, unspecified: Secondary | ICD-10-CM | POA: Diagnosis not present

## 2021-07-13 DIAGNOSIS — M6281 Muscle weakness (generalized): Secondary | ICD-10-CM | POA: Diagnosis not present

## 2021-07-13 DIAGNOSIS — R2689 Other abnormalities of gait and mobility: Secondary | ICD-10-CM | POA: Diagnosis not present

## 2021-07-13 DIAGNOSIS — G309 Alzheimer's disease, unspecified: Secondary | ICD-10-CM | POA: Diagnosis not present

## 2021-07-13 DIAGNOSIS — R41841 Cognitive communication deficit: Secondary | ICD-10-CM | POA: Diagnosis not present

## 2021-07-13 DIAGNOSIS — R1312 Dysphagia, oropharyngeal phase: Secondary | ICD-10-CM | POA: Diagnosis not present

## 2021-07-14 DIAGNOSIS — G309 Alzheimer's disease, unspecified: Secondary | ICD-10-CM | POA: Diagnosis not present

## 2021-07-14 DIAGNOSIS — R41841 Cognitive communication deficit: Secondary | ICD-10-CM | POA: Diagnosis not present

## 2021-07-14 DIAGNOSIS — R1312 Dysphagia, oropharyngeal phase: Secondary | ICD-10-CM | POA: Diagnosis not present

## 2021-07-14 DIAGNOSIS — R2689 Other abnormalities of gait and mobility: Secondary | ICD-10-CM | POA: Diagnosis not present

## 2021-07-14 DIAGNOSIS — M6281 Muscle weakness (generalized): Secondary | ICD-10-CM | POA: Diagnosis not present

## 2021-07-17 DIAGNOSIS — G309 Alzheimer's disease, unspecified: Secondary | ICD-10-CM | POA: Diagnosis not present

## 2021-07-17 DIAGNOSIS — R2689 Other abnormalities of gait and mobility: Secondary | ICD-10-CM | POA: Diagnosis not present

## 2021-07-17 DIAGNOSIS — M6281 Muscle weakness (generalized): Secondary | ICD-10-CM | POA: Diagnosis not present

## 2021-07-17 DIAGNOSIS — R1312 Dysphagia, oropharyngeal phase: Secondary | ICD-10-CM | POA: Diagnosis not present

## 2021-07-17 DIAGNOSIS — R41841 Cognitive communication deficit: Secondary | ICD-10-CM | POA: Diagnosis not present

## 2021-07-19 DIAGNOSIS — M6281 Muscle weakness (generalized): Secondary | ICD-10-CM | POA: Diagnosis not present

## 2021-07-19 DIAGNOSIS — R41841 Cognitive communication deficit: Secondary | ICD-10-CM | POA: Diagnosis not present

## 2021-07-19 DIAGNOSIS — R1312 Dysphagia, oropharyngeal phase: Secondary | ICD-10-CM | POA: Diagnosis not present

## 2021-07-19 DIAGNOSIS — R2689 Other abnormalities of gait and mobility: Secondary | ICD-10-CM | POA: Diagnosis not present

## 2021-07-19 DIAGNOSIS — G309 Alzheimer's disease, unspecified: Secondary | ICD-10-CM | POA: Diagnosis not present

## 2021-07-20 DIAGNOSIS — F5101 Primary insomnia: Secondary | ICD-10-CM | POA: Diagnosis not present

## 2021-07-20 DIAGNOSIS — R2689 Other abnormalities of gait and mobility: Secondary | ICD-10-CM | POA: Diagnosis not present

## 2021-07-20 DIAGNOSIS — R41841 Cognitive communication deficit: Secondary | ICD-10-CM | POA: Diagnosis not present

## 2021-07-20 DIAGNOSIS — R1312 Dysphagia, oropharyngeal phase: Secondary | ICD-10-CM | POA: Diagnosis not present

## 2021-07-20 DIAGNOSIS — M6281 Muscle weakness (generalized): Secondary | ICD-10-CM | POA: Diagnosis not present

## 2021-07-20 DIAGNOSIS — F29 Unspecified psychosis not due to a substance or known physiological condition: Secondary | ICD-10-CM | POA: Diagnosis not present

## 2021-07-20 DIAGNOSIS — F0390 Unspecified dementia without behavioral disturbance: Secondary | ICD-10-CM | POA: Diagnosis not present

## 2021-07-20 DIAGNOSIS — G309 Alzheimer's disease, unspecified: Secondary | ICD-10-CM | POA: Diagnosis not present

## 2021-07-21 DIAGNOSIS — R1312 Dysphagia, oropharyngeal phase: Secondary | ICD-10-CM | POA: Diagnosis not present

## 2021-07-21 DIAGNOSIS — M6281 Muscle weakness (generalized): Secondary | ICD-10-CM | POA: Diagnosis not present

## 2021-07-21 DIAGNOSIS — R41841 Cognitive communication deficit: Secondary | ICD-10-CM | POA: Diagnosis not present

## 2021-07-21 DIAGNOSIS — G309 Alzheimer's disease, unspecified: Secondary | ICD-10-CM | POA: Diagnosis not present

## 2021-07-21 DIAGNOSIS — R2689 Other abnormalities of gait and mobility: Secondary | ICD-10-CM | POA: Diagnosis not present

## 2021-07-24 DIAGNOSIS — R2689 Other abnormalities of gait and mobility: Secondary | ICD-10-CM | POA: Diagnosis not present

## 2021-07-24 DIAGNOSIS — R1312 Dysphagia, oropharyngeal phase: Secondary | ICD-10-CM | POA: Diagnosis not present

## 2021-07-24 DIAGNOSIS — R41841 Cognitive communication deficit: Secondary | ICD-10-CM | POA: Diagnosis not present

## 2021-07-24 DIAGNOSIS — M6281 Muscle weakness (generalized): Secondary | ICD-10-CM | POA: Diagnosis not present

## 2021-07-24 DIAGNOSIS — G309 Alzheimer's disease, unspecified: Secondary | ICD-10-CM | POA: Diagnosis not present

## 2021-07-25 DIAGNOSIS — R1312 Dysphagia, oropharyngeal phase: Secondary | ICD-10-CM | POA: Diagnosis not present

## 2021-07-25 DIAGNOSIS — R41841 Cognitive communication deficit: Secondary | ICD-10-CM | POA: Diagnosis not present

## 2021-07-25 DIAGNOSIS — G309 Alzheimer's disease, unspecified: Secondary | ICD-10-CM | POA: Diagnosis not present

## 2021-07-25 DIAGNOSIS — M6281 Muscle weakness (generalized): Secondary | ICD-10-CM | POA: Diagnosis not present

## 2021-07-25 DIAGNOSIS — R2689 Other abnormalities of gait and mobility: Secondary | ICD-10-CM | POA: Diagnosis not present

## 2021-07-26 DIAGNOSIS — G309 Alzheimer's disease, unspecified: Secondary | ICD-10-CM | POA: Diagnosis not present

## 2021-07-26 DIAGNOSIS — M6281 Muscle weakness (generalized): Secondary | ICD-10-CM | POA: Diagnosis not present

## 2021-07-26 DIAGNOSIS — R2689 Other abnormalities of gait and mobility: Secondary | ICD-10-CM | POA: Diagnosis not present

## 2021-07-26 DIAGNOSIS — R1312 Dysphagia, oropharyngeal phase: Secondary | ICD-10-CM | POA: Diagnosis not present

## 2021-07-26 DIAGNOSIS — R41841 Cognitive communication deficit: Secondary | ICD-10-CM | POA: Diagnosis not present

## 2021-07-27 DIAGNOSIS — H579 Unspecified disorder of eye and adnexa: Secondary | ICD-10-CM | POA: Diagnosis not present

## 2021-07-27 DIAGNOSIS — R41841 Cognitive communication deficit: Secondary | ICD-10-CM | POA: Diagnosis not present

## 2021-07-27 DIAGNOSIS — Z23 Encounter for immunization: Secondary | ICD-10-CM | POA: Diagnosis not present

## 2021-07-27 DIAGNOSIS — R532 Functional quadriplegia: Secondary | ICD-10-CM | POA: Diagnosis not present

## 2021-07-27 DIAGNOSIS — R2689 Other abnormalities of gait and mobility: Secondary | ICD-10-CM | POA: Diagnosis not present

## 2021-07-27 DIAGNOSIS — I10 Essential (primary) hypertension: Secondary | ICD-10-CM | POA: Diagnosis not present

## 2021-07-27 DIAGNOSIS — Z299 Encounter for prophylactic measures, unspecified: Secondary | ICD-10-CM | POA: Diagnosis not present

## 2021-07-27 DIAGNOSIS — M6281 Muscle weakness (generalized): Secondary | ICD-10-CM | POA: Diagnosis not present

## 2021-07-27 DIAGNOSIS — F0390 Unspecified dementia without behavioral disturbance: Secondary | ICD-10-CM | POA: Diagnosis not present

## 2021-07-27 DIAGNOSIS — G309 Alzheimer's disease, unspecified: Secondary | ICD-10-CM | POA: Diagnosis not present

## 2021-07-27 DIAGNOSIS — R1312 Dysphagia, oropharyngeal phase: Secondary | ICD-10-CM | POA: Diagnosis not present

## 2021-07-31 DIAGNOSIS — R1312 Dysphagia, oropharyngeal phase: Secondary | ICD-10-CM | POA: Diagnosis not present

## 2021-07-31 DIAGNOSIS — G309 Alzheimer's disease, unspecified: Secondary | ICD-10-CM | POA: Diagnosis not present

## 2021-07-31 DIAGNOSIS — R41841 Cognitive communication deficit: Secondary | ICD-10-CM | POA: Diagnosis not present

## 2021-08-01 DIAGNOSIS — G309 Alzheimer's disease, unspecified: Secondary | ICD-10-CM | POA: Diagnosis not present

## 2021-08-01 DIAGNOSIS — R41841 Cognitive communication deficit: Secondary | ICD-10-CM | POA: Diagnosis not present

## 2021-08-01 DIAGNOSIS — R1312 Dysphagia, oropharyngeal phase: Secondary | ICD-10-CM | POA: Diagnosis not present

## 2021-08-03 DIAGNOSIS — R1312 Dysphagia, oropharyngeal phase: Secondary | ICD-10-CM | POA: Diagnosis not present

## 2021-08-03 DIAGNOSIS — G309 Alzheimer's disease, unspecified: Secondary | ICD-10-CM | POA: Diagnosis not present

## 2021-08-03 DIAGNOSIS — R41841 Cognitive communication deficit: Secondary | ICD-10-CM | POA: Diagnosis not present

## 2021-08-07 DIAGNOSIS — H04123 Dry eye syndrome of bilateral lacrimal glands: Secondary | ICD-10-CM | POA: Diagnosis not present

## 2021-08-10 DIAGNOSIS — Z299 Encounter for prophylactic measures, unspecified: Secondary | ICD-10-CM | POA: Diagnosis not present

## 2021-08-10 DIAGNOSIS — N760 Acute vaginitis: Secondary | ICD-10-CM | POA: Diagnosis not present

## 2021-08-10 DIAGNOSIS — B9689 Other specified bacterial agents as the cause of diseases classified elsewhere: Secondary | ICD-10-CM | POA: Diagnosis not present

## 2021-08-10 DIAGNOSIS — N1831 Chronic kidney disease, stage 3a: Secondary | ICD-10-CM | POA: Diagnosis not present

## 2021-08-10 DIAGNOSIS — I10 Essential (primary) hypertension: Secondary | ICD-10-CM | POA: Diagnosis not present

## 2021-08-10 DIAGNOSIS — E1165 Type 2 diabetes mellitus with hyperglycemia: Secondary | ICD-10-CM | POA: Diagnosis not present

## 2021-08-17 DIAGNOSIS — F5101 Primary insomnia: Secondary | ICD-10-CM | POA: Diagnosis not present

## 2021-08-17 DIAGNOSIS — F0390 Unspecified dementia without behavioral disturbance: Secondary | ICD-10-CM | POA: Diagnosis not present

## 2021-08-17 DIAGNOSIS — F29 Unspecified psychosis not due to a substance or known physiological condition: Secondary | ICD-10-CM | POA: Diagnosis not present

## 2021-09-12 DIAGNOSIS — Z23 Encounter for immunization: Secondary | ICD-10-CM | POA: Diagnosis not present

## 2021-09-20 DIAGNOSIS — F32A Depression, unspecified: Secondary | ICD-10-CM | POA: Diagnosis not present

## 2021-09-20 DIAGNOSIS — F29 Unspecified psychosis not due to a substance or known physiological condition: Secondary | ICD-10-CM | POA: Diagnosis not present

## 2021-09-20 DIAGNOSIS — F0390 Unspecified dementia without behavioral disturbance: Secondary | ICD-10-CM | POA: Diagnosis not present

## 2021-09-20 DIAGNOSIS — F5101 Primary insomnia: Secondary | ICD-10-CM | POA: Diagnosis not present

## 2021-09-28 DIAGNOSIS — Z299 Encounter for prophylactic measures, unspecified: Secondary | ICD-10-CM | POA: Diagnosis not present

## 2021-09-28 DIAGNOSIS — I10 Essential (primary) hypertension: Secondary | ICD-10-CM | POA: Diagnosis not present

## 2021-09-28 DIAGNOSIS — J101 Influenza due to other identified influenza virus with other respiratory manifestations: Secondary | ICD-10-CM | POA: Diagnosis not present

## 2021-09-28 DIAGNOSIS — E1165 Type 2 diabetes mellitus with hyperglycemia: Secondary | ICD-10-CM | POA: Diagnosis not present

## 2021-09-28 DIAGNOSIS — R532 Functional quadriplegia: Secondary | ICD-10-CM | POA: Diagnosis not present

## 2021-10-18 DIAGNOSIS — F29 Unspecified psychosis not due to a substance or known physiological condition: Secondary | ICD-10-CM | POA: Diagnosis not present

## 2021-10-18 DIAGNOSIS — F5101 Primary insomnia: Secondary | ICD-10-CM | POA: Diagnosis not present

## 2021-10-18 DIAGNOSIS — F32A Depression, unspecified: Secondary | ICD-10-CM | POA: Diagnosis not present

## 2021-10-18 DIAGNOSIS — F0390 Unspecified dementia without behavioral disturbance: Secondary | ICD-10-CM | POA: Diagnosis not present

## 2021-11-14 DIAGNOSIS — N760 Acute vaginitis: Secondary | ICD-10-CM | POA: Diagnosis not present

## 2021-11-14 DIAGNOSIS — B9689 Other specified bacterial agents as the cause of diseases classified elsewhere: Secondary | ICD-10-CM | POA: Diagnosis not present

## 2021-11-14 DIAGNOSIS — I700 Atherosclerosis of aorta: Secondary | ICD-10-CM | POA: Diagnosis not present

## 2021-11-14 DIAGNOSIS — R532 Functional quadriplegia: Secondary | ICD-10-CM | POA: Diagnosis not present

## 2021-11-14 DIAGNOSIS — Z299 Encounter for prophylactic measures, unspecified: Secondary | ICD-10-CM | POA: Diagnosis not present

## 2021-11-20 DIAGNOSIS — R54 Age-related physical debility: Secondary | ICD-10-CM | POA: Diagnosis not present

## 2021-11-20 DIAGNOSIS — R41841 Cognitive communication deficit: Secondary | ICD-10-CM | POA: Diagnosis not present

## 2021-11-20 DIAGNOSIS — G309 Alzheimer's disease, unspecified: Secondary | ICD-10-CM | POA: Diagnosis not present

## 2021-11-20 DIAGNOSIS — R2689 Other abnormalities of gait and mobility: Secondary | ICD-10-CM | POA: Diagnosis not present

## 2021-11-20 DIAGNOSIS — R1310 Dysphagia, unspecified: Secondary | ICD-10-CM | POA: Diagnosis not present

## 2021-11-20 DIAGNOSIS — M6281 Muscle weakness (generalized): Secondary | ICD-10-CM | POA: Diagnosis not present

## 2021-11-21 DIAGNOSIS — R41841 Cognitive communication deficit: Secondary | ICD-10-CM | POA: Diagnosis not present

## 2021-11-21 DIAGNOSIS — R54 Age-related physical debility: Secondary | ICD-10-CM | POA: Diagnosis not present

## 2021-11-21 DIAGNOSIS — R2689 Other abnormalities of gait and mobility: Secondary | ICD-10-CM | POA: Diagnosis not present

## 2021-11-21 DIAGNOSIS — R630 Anorexia: Secondary | ICD-10-CM | POA: Diagnosis not present

## 2021-11-21 DIAGNOSIS — Z299 Encounter for prophylactic measures, unspecified: Secondary | ICD-10-CM | POA: Diagnosis not present

## 2021-11-21 DIAGNOSIS — R1310 Dysphagia, unspecified: Secondary | ICD-10-CM | POA: Diagnosis not present

## 2021-11-21 DIAGNOSIS — M6281 Muscle weakness (generalized): Secondary | ICD-10-CM | POA: Diagnosis not present

## 2021-11-21 DIAGNOSIS — F0390 Unspecified dementia without behavioral disturbance: Secondary | ICD-10-CM | POA: Diagnosis not present

## 2021-11-21 DIAGNOSIS — G309 Alzheimer's disease, unspecified: Secondary | ICD-10-CM | POA: Diagnosis not present

## 2021-11-22 DIAGNOSIS — M6281 Muscle weakness (generalized): Secondary | ICD-10-CM | POA: Diagnosis not present

## 2021-11-22 DIAGNOSIS — R41841 Cognitive communication deficit: Secondary | ICD-10-CM | POA: Diagnosis not present

## 2021-11-22 DIAGNOSIS — G309 Alzheimer's disease, unspecified: Secondary | ICD-10-CM | POA: Diagnosis not present

## 2021-11-22 DIAGNOSIS — R54 Age-related physical debility: Secondary | ICD-10-CM | POA: Diagnosis not present

## 2021-11-22 DIAGNOSIS — R1310 Dysphagia, unspecified: Secondary | ICD-10-CM | POA: Diagnosis not present

## 2021-11-22 DIAGNOSIS — R2689 Other abnormalities of gait and mobility: Secondary | ICD-10-CM | POA: Diagnosis not present

## 2021-11-23 DIAGNOSIS — M6281 Muscle weakness (generalized): Secondary | ICD-10-CM | POA: Diagnosis not present

## 2021-11-23 DIAGNOSIS — R2689 Other abnormalities of gait and mobility: Secondary | ICD-10-CM | POA: Diagnosis not present

## 2021-11-23 DIAGNOSIS — R54 Age-related physical debility: Secondary | ICD-10-CM | POA: Diagnosis not present

## 2021-11-23 DIAGNOSIS — G309 Alzheimer's disease, unspecified: Secondary | ICD-10-CM | POA: Diagnosis not present

## 2021-11-23 DIAGNOSIS — R41841 Cognitive communication deficit: Secondary | ICD-10-CM | POA: Diagnosis not present

## 2021-11-23 DIAGNOSIS — R1310 Dysphagia, unspecified: Secondary | ICD-10-CM | POA: Diagnosis not present

## 2021-11-24 DIAGNOSIS — R41841 Cognitive communication deficit: Secondary | ICD-10-CM | POA: Diagnosis not present

## 2021-11-24 DIAGNOSIS — R54 Age-related physical debility: Secondary | ICD-10-CM | POA: Diagnosis not present

## 2021-11-24 DIAGNOSIS — R1310 Dysphagia, unspecified: Secondary | ICD-10-CM | POA: Diagnosis not present

## 2021-11-24 DIAGNOSIS — R2689 Other abnormalities of gait and mobility: Secondary | ICD-10-CM | POA: Diagnosis not present

## 2021-11-24 DIAGNOSIS — G309 Alzheimer's disease, unspecified: Secondary | ICD-10-CM | POA: Diagnosis not present

## 2021-11-24 DIAGNOSIS — M6281 Muscle weakness (generalized): Secondary | ICD-10-CM | POA: Diagnosis not present

## 2021-11-27 DIAGNOSIS — R2689 Other abnormalities of gait and mobility: Secondary | ICD-10-CM | POA: Diagnosis not present

## 2021-11-27 DIAGNOSIS — R1310 Dysphagia, unspecified: Secondary | ICD-10-CM | POA: Diagnosis not present

## 2021-11-27 DIAGNOSIS — R54 Age-related physical debility: Secondary | ICD-10-CM | POA: Diagnosis not present

## 2021-11-27 DIAGNOSIS — G309 Alzheimer's disease, unspecified: Secondary | ICD-10-CM | POA: Diagnosis not present

## 2021-11-27 DIAGNOSIS — M6281 Muscle weakness (generalized): Secondary | ICD-10-CM | POA: Diagnosis not present

## 2021-11-27 DIAGNOSIS — R41841 Cognitive communication deficit: Secondary | ICD-10-CM | POA: Diagnosis not present

## 2021-11-28 DIAGNOSIS — M6281 Muscle weakness (generalized): Secondary | ICD-10-CM | POA: Diagnosis not present

## 2021-11-28 DIAGNOSIS — R41841 Cognitive communication deficit: Secondary | ICD-10-CM | POA: Diagnosis not present

## 2021-11-28 DIAGNOSIS — R532 Functional quadriplegia: Secondary | ICD-10-CM | POA: Diagnosis not present

## 2021-11-28 DIAGNOSIS — Z299 Encounter for prophylactic measures, unspecified: Secondary | ICD-10-CM | POA: Diagnosis not present

## 2021-11-28 DIAGNOSIS — F0390 Unspecified dementia without behavioral disturbance: Secondary | ICD-10-CM | POA: Diagnosis not present

## 2021-11-28 DIAGNOSIS — R54 Age-related physical debility: Secondary | ICD-10-CM | POA: Diagnosis not present

## 2021-11-28 DIAGNOSIS — R2689 Other abnormalities of gait and mobility: Secondary | ICD-10-CM | POA: Diagnosis not present

## 2021-11-28 DIAGNOSIS — G309 Alzheimer's disease, unspecified: Secondary | ICD-10-CM | POA: Diagnosis not present

## 2021-11-28 DIAGNOSIS — R630 Anorexia: Secondary | ICD-10-CM | POA: Diagnosis not present

## 2021-11-28 DIAGNOSIS — R1310 Dysphagia, unspecified: Secondary | ICD-10-CM | POA: Diagnosis not present

## 2021-11-29 DIAGNOSIS — R1310 Dysphagia, unspecified: Secondary | ICD-10-CM | POA: Diagnosis not present

## 2021-11-29 DIAGNOSIS — R2689 Other abnormalities of gait and mobility: Secondary | ICD-10-CM | POA: Diagnosis not present

## 2021-11-29 DIAGNOSIS — R54 Age-related physical debility: Secondary | ICD-10-CM | POA: Diagnosis not present

## 2021-11-29 DIAGNOSIS — G309 Alzheimer's disease, unspecified: Secondary | ICD-10-CM | POA: Diagnosis not present

## 2021-11-29 DIAGNOSIS — R41841 Cognitive communication deficit: Secondary | ICD-10-CM | POA: Diagnosis not present

## 2021-11-29 DIAGNOSIS — M6281 Muscle weakness (generalized): Secondary | ICD-10-CM | POA: Diagnosis not present

## 2021-11-30 DIAGNOSIS — R1310 Dysphagia, unspecified: Secondary | ICD-10-CM | POA: Diagnosis not present

## 2021-11-30 DIAGNOSIS — R2689 Other abnormalities of gait and mobility: Secondary | ICD-10-CM | POA: Diagnosis not present

## 2021-11-30 DIAGNOSIS — M6281 Muscle weakness (generalized): Secondary | ICD-10-CM | POA: Diagnosis not present

## 2021-11-30 DIAGNOSIS — R41841 Cognitive communication deficit: Secondary | ICD-10-CM | POA: Diagnosis not present

## 2021-11-30 DIAGNOSIS — G309 Alzheimer's disease, unspecified: Secondary | ICD-10-CM | POA: Diagnosis not present

## 2021-11-30 DIAGNOSIS — R54 Age-related physical debility: Secondary | ICD-10-CM | POA: Diagnosis not present

## 2021-12-01 DIAGNOSIS — G309 Alzheimer's disease, unspecified: Secondary | ICD-10-CM | POA: Diagnosis not present

## 2021-12-01 DIAGNOSIS — R41841 Cognitive communication deficit: Secondary | ICD-10-CM | POA: Diagnosis not present

## 2021-12-01 DIAGNOSIS — R2689 Other abnormalities of gait and mobility: Secondary | ICD-10-CM | POA: Diagnosis not present

## 2021-12-01 DIAGNOSIS — R1310 Dysphagia, unspecified: Secondary | ICD-10-CM | POA: Diagnosis not present

## 2021-12-01 DIAGNOSIS — M6281 Muscle weakness (generalized): Secondary | ICD-10-CM | POA: Diagnosis not present

## 2021-12-01 DIAGNOSIS — R54 Age-related physical debility: Secondary | ICD-10-CM | POA: Diagnosis not present

## 2021-12-02 DIAGNOSIS — R41841 Cognitive communication deficit: Secondary | ICD-10-CM | POA: Diagnosis not present

## 2021-12-02 DIAGNOSIS — R54 Age-related physical debility: Secondary | ICD-10-CM | POA: Diagnosis not present

## 2021-12-02 DIAGNOSIS — R2689 Other abnormalities of gait and mobility: Secondary | ICD-10-CM | POA: Diagnosis not present

## 2021-12-02 DIAGNOSIS — M6281 Muscle weakness (generalized): Secondary | ICD-10-CM | POA: Diagnosis not present

## 2021-12-02 DIAGNOSIS — R1310 Dysphagia, unspecified: Secondary | ICD-10-CM | POA: Diagnosis not present

## 2021-12-02 DIAGNOSIS — G309 Alzheimer's disease, unspecified: Secondary | ICD-10-CM | POA: Diagnosis not present

## 2021-12-04 DIAGNOSIS — R1310 Dysphagia, unspecified: Secondary | ICD-10-CM | POA: Diagnosis not present

## 2021-12-04 DIAGNOSIS — R41841 Cognitive communication deficit: Secondary | ICD-10-CM | POA: Diagnosis not present

## 2021-12-04 DIAGNOSIS — M6281 Muscle weakness (generalized): Secondary | ICD-10-CM | POA: Diagnosis not present

## 2021-12-04 DIAGNOSIS — R54 Age-related physical debility: Secondary | ICD-10-CM | POA: Diagnosis not present

## 2021-12-04 DIAGNOSIS — R2689 Other abnormalities of gait and mobility: Secondary | ICD-10-CM | POA: Diagnosis not present

## 2021-12-04 DIAGNOSIS — G309 Alzheimer's disease, unspecified: Secondary | ICD-10-CM | POA: Diagnosis not present

## 2021-12-05 DIAGNOSIS — F0390 Unspecified dementia without behavioral disturbance: Secondary | ICD-10-CM | POA: Diagnosis not present

## 2021-12-05 DIAGNOSIS — G309 Alzheimer's disease, unspecified: Secondary | ICD-10-CM | POA: Diagnosis not present

## 2021-12-05 DIAGNOSIS — Z299 Encounter for prophylactic measures, unspecified: Secondary | ICD-10-CM | POA: Diagnosis not present

## 2021-12-05 DIAGNOSIS — R54 Age-related physical debility: Secondary | ICD-10-CM | POA: Diagnosis not present

## 2021-12-05 DIAGNOSIS — R630 Anorexia: Secondary | ICD-10-CM | POA: Diagnosis not present

## 2021-12-05 DIAGNOSIS — R41841 Cognitive communication deficit: Secondary | ICD-10-CM | POA: Diagnosis not present

## 2021-12-05 DIAGNOSIS — R2689 Other abnormalities of gait and mobility: Secondary | ICD-10-CM | POA: Diagnosis not present

## 2021-12-05 DIAGNOSIS — R1310 Dysphagia, unspecified: Secondary | ICD-10-CM | POA: Diagnosis not present

## 2021-12-05 DIAGNOSIS — M6281 Muscle weakness (generalized): Secondary | ICD-10-CM | POA: Diagnosis not present

## 2021-12-06 ENCOUNTER — Encounter: Payer: Self-pay | Admitting: Internal Medicine

## 2021-12-06 DIAGNOSIS — R41841 Cognitive communication deficit: Secondary | ICD-10-CM | POA: Diagnosis not present

## 2021-12-06 DIAGNOSIS — R2689 Other abnormalities of gait and mobility: Secondary | ICD-10-CM | POA: Diagnosis not present

## 2021-12-06 DIAGNOSIS — M6281 Muscle weakness (generalized): Secondary | ICD-10-CM | POA: Diagnosis not present

## 2021-12-06 DIAGNOSIS — R1310 Dysphagia, unspecified: Secondary | ICD-10-CM | POA: Diagnosis not present

## 2021-12-06 DIAGNOSIS — G309 Alzheimer's disease, unspecified: Secondary | ICD-10-CM | POA: Diagnosis not present

## 2021-12-06 DIAGNOSIS — R54 Age-related physical debility: Secondary | ICD-10-CM | POA: Diagnosis not present

## 2021-12-07 DIAGNOSIS — R41841 Cognitive communication deficit: Secondary | ICD-10-CM | POA: Diagnosis not present

## 2021-12-07 DIAGNOSIS — R54 Age-related physical debility: Secondary | ICD-10-CM | POA: Diagnosis not present

## 2021-12-07 DIAGNOSIS — M6281 Muscle weakness (generalized): Secondary | ICD-10-CM | POA: Diagnosis not present

## 2021-12-07 DIAGNOSIS — R1310 Dysphagia, unspecified: Secondary | ICD-10-CM | POA: Diagnosis not present

## 2021-12-07 DIAGNOSIS — R2689 Other abnormalities of gait and mobility: Secondary | ICD-10-CM | POA: Diagnosis not present

## 2021-12-07 DIAGNOSIS — G309 Alzheimer's disease, unspecified: Secondary | ICD-10-CM | POA: Diagnosis not present

## 2021-12-08 DIAGNOSIS — M6281 Muscle weakness (generalized): Secondary | ICD-10-CM | POA: Diagnosis not present

## 2021-12-08 DIAGNOSIS — G309 Alzheimer's disease, unspecified: Secondary | ICD-10-CM | POA: Diagnosis not present

## 2021-12-08 DIAGNOSIS — R54 Age-related physical debility: Secondary | ICD-10-CM | POA: Diagnosis not present

## 2021-12-08 DIAGNOSIS — R41841 Cognitive communication deficit: Secondary | ICD-10-CM | POA: Diagnosis not present

## 2021-12-08 DIAGNOSIS — R1310 Dysphagia, unspecified: Secondary | ICD-10-CM | POA: Diagnosis not present

## 2021-12-08 DIAGNOSIS — R2689 Other abnormalities of gait and mobility: Secondary | ICD-10-CM | POA: Diagnosis not present

## 2021-12-11 DIAGNOSIS — R1310 Dysphagia, unspecified: Secondary | ICD-10-CM | POA: Diagnosis not present

## 2021-12-11 DIAGNOSIS — R2689 Other abnormalities of gait and mobility: Secondary | ICD-10-CM | POA: Diagnosis not present

## 2021-12-11 DIAGNOSIS — M6281 Muscle weakness (generalized): Secondary | ICD-10-CM | POA: Diagnosis not present

## 2021-12-11 DIAGNOSIS — R41841 Cognitive communication deficit: Secondary | ICD-10-CM | POA: Diagnosis not present

## 2021-12-11 DIAGNOSIS — G309 Alzheimer's disease, unspecified: Secondary | ICD-10-CM | POA: Diagnosis not present

## 2021-12-11 DIAGNOSIS — R54 Age-related physical debility: Secondary | ICD-10-CM | POA: Diagnosis not present

## 2021-12-12 DIAGNOSIS — F0390 Unspecified dementia without behavioral disturbance: Secondary | ICD-10-CM | POA: Diagnosis not present

## 2021-12-12 DIAGNOSIS — R1310 Dysphagia, unspecified: Secondary | ICD-10-CM | POA: Diagnosis not present

## 2021-12-12 DIAGNOSIS — R630 Anorexia: Secondary | ICD-10-CM | POA: Diagnosis not present

## 2021-12-12 DIAGNOSIS — R2689 Other abnormalities of gait and mobility: Secondary | ICD-10-CM | POA: Diagnosis not present

## 2021-12-12 DIAGNOSIS — M6281 Muscle weakness (generalized): Secondary | ICD-10-CM | POA: Diagnosis not present

## 2021-12-12 DIAGNOSIS — G309 Alzheimer's disease, unspecified: Secondary | ICD-10-CM | POA: Diagnosis not present

## 2021-12-12 DIAGNOSIS — R54 Age-related physical debility: Secondary | ICD-10-CM | POA: Diagnosis not present

## 2021-12-12 DIAGNOSIS — Z299 Encounter for prophylactic measures, unspecified: Secondary | ICD-10-CM | POA: Diagnosis not present

## 2021-12-12 DIAGNOSIS — R41841 Cognitive communication deficit: Secondary | ICD-10-CM | POA: Diagnosis not present

## 2021-12-13 DIAGNOSIS — R54 Age-related physical debility: Secondary | ICD-10-CM | POA: Diagnosis not present

## 2021-12-13 DIAGNOSIS — M6281 Muscle weakness (generalized): Secondary | ICD-10-CM | POA: Diagnosis not present

## 2021-12-13 DIAGNOSIS — R1310 Dysphagia, unspecified: Secondary | ICD-10-CM | POA: Diagnosis not present

## 2021-12-13 DIAGNOSIS — R2689 Other abnormalities of gait and mobility: Secondary | ICD-10-CM | POA: Diagnosis not present

## 2021-12-13 DIAGNOSIS — R41841 Cognitive communication deficit: Secondary | ICD-10-CM | POA: Diagnosis not present

## 2021-12-13 DIAGNOSIS — G309 Alzheimer's disease, unspecified: Secondary | ICD-10-CM | POA: Diagnosis not present

## 2021-12-14 DIAGNOSIS — M6281 Muscle weakness (generalized): Secondary | ICD-10-CM | POA: Diagnosis not present

## 2021-12-14 DIAGNOSIS — G309 Alzheimer's disease, unspecified: Secondary | ICD-10-CM | POA: Diagnosis not present

## 2021-12-14 DIAGNOSIS — R1310 Dysphagia, unspecified: Secondary | ICD-10-CM | POA: Diagnosis not present

## 2021-12-14 DIAGNOSIS — R2689 Other abnormalities of gait and mobility: Secondary | ICD-10-CM | POA: Diagnosis not present

## 2021-12-14 DIAGNOSIS — R41841 Cognitive communication deficit: Secondary | ICD-10-CM | POA: Diagnosis not present

## 2021-12-14 DIAGNOSIS — R54 Age-related physical debility: Secondary | ICD-10-CM | POA: Diagnosis not present

## 2021-12-15 DIAGNOSIS — R1310 Dysphagia, unspecified: Secondary | ICD-10-CM | POA: Diagnosis not present

## 2021-12-15 DIAGNOSIS — R54 Age-related physical debility: Secondary | ICD-10-CM | POA: Diagnosis not present

## 2021-12-15 DIAGNOSIS — R2689 Other abnormalities of gait and mobility: Secondary | ICD-10-CM | POA: Diagnosis not present

## 2021-12-15 DIAGNOSIS — M6281 Muscle weakness (generalized): Secondary | ICD-10-CM | POA: Diagnosis not present

## 2021-12-15 DIAGNOSIS — R41841 Cognitive communication deficit: Secondary | ICD-10-CM | POA: Diagnosis not present

## 2021-12-15 DIAGNOSIS — G309 Alzheimer's disease, unspecified: Secondary | ICD-10-CM | POA: Diagnosis not present

## 2021-12-18 DIAGNOSIS — R1310 Dysphagia, unspecified: Secondary | ICD-10-CM | POA: Diagnosis not present

## 2021-12-18 DIAGNOSIS — R41841 Cognitive communication deficit: Secondary | ICD-10-CM | POA: Diagnosis not present

## 2021-12-18 DIAGNOSIS — R2689 Other abnormalities of gait and mobility: Secondary | ICD-10-CM | POA: Diagnosis not present

## 2021-12-18 DIAGNOSIS — M6281 Muscle weakness (generalized): Secondary | ICD-10-CM | POA: Diagnosis not present

## 2021-12-18 DIAGNOSIS — G309 Alzheimer's disease, unspecified: Secondary | ICD-10-CM | POA: Diagnosis not present

## 2021-12-18 DIAGNOSIS — R54 Age-related physical debility: Secondary | ICD-10-CM | POA: Diagnosis not present

## 2021-12-19 DIAGNOSIS — E1151 Type 2 diabetes mellitus with diabetic peripheral angiopathy without gangrene: Secondary | ICD-10-CM | POA: Diagnosis not present

## 2021-12-19 DIAGNOSIS — B351 Tinea unguium: Secondary | ICD-10-CM | POA: Diagnosis not present

## 2021-12-19 DIAGNOSIS — R41841 Cognitive communication deficit: Secondary | ICD-10-CM | POA: Diagnosis not present

## 2021-12-19 DIAGNOSIS — Z7984 Long term (current) use of oral hypoglycemic drugs: Secondary | ICD-10-CM | POA: Diagnosis not present

## 2021-12-19 DIAGNOSIS — R54 Age-related physical debility: Secondary | ICD-10-CM | POA: Diagnosis not present

## 2021-12-19 DIAGNOSIS — L603 Nail dystrophy: Secondary | ICD-10-CM | POA: Diagnosis not present

## 2021-12-19 DIAGNOSIS — G309 Alzheimer's disease, unspecified: Secondary | ICD-10-CM | POA: Diagnosis not present

## 2021-12-19 DIAGNOSIS — M6281 Muscle weakness (generalized): Secondary | ICD-10-CM | POA: Diagnosis not present

## 2021-12-19 DIAGNOSIS — R1310 Dysphagia, unspecified: Secondary | ICD-10-CM | POA: Diagnosis not present

## 2021-12-19 DIAGNOSIS — R2689 Other abnormalities of gait and mobility: Secondary | ICD-10-CM | POA: Diagnosis not present

## 2021-12-20 DIAGNOSIS — R41841 Cognitive communication deficit: Secondary | ICD-10-CM | POA: Diagnosis not present

## 2021-12-20 DIAGNOSIS — R1310 Dysphagia, unspecified: Secondary | ICD-10-CM | POA: Diagnosis not present

## 2021-12-20 DIAGNOSIS — R2689 Other abnormalities of gait and mobility: Secondary | ICD-10-CM | POA: Diagnosis not present

## 2021-12-20 DIAGNOSIS — R54 Age-related physical debility: Secondary | ICD-10-CM | POA: Diagnosis not present

## 2021-12-20 DIAGNOSIS — M6281 Muscle weakness (generalized): Secondary | ICD-10-CM | POA: Diagnosis not present

## 2021-12-20 DIAGNOSIS — G309 Alzheimer's disease, unspecified: Secondary | ICD-10-CM | POA: Diagnosis not present

## 2021-12-21 DIAGNOSIS — F02C18 Dementia in other diseases classified elsewhere, severe, with other behavioral disturbance: Secondary | ICD-10-CM | POA: Diagnosis not present

## 2021-12-21 DIAGNOSIS — R1310 Dysphagia, unspecified: Secondary | ICD-10-CM | POA: Diagnosis not present

## 2021-12-21 DIAGNOSIS — R41841 Cognitive communication deficit: Secondary | ICD-10-CM | POA: Diagnosis not present

## 2021-12-21 DIAGNOSIS — R54 Age-related physical debility: Secondary | ICD-10-CM | POA: Diagnosis not present

## 2021-12-21 DIAGNOSIS — G308 Other Alzheimer's disease: Secondary | ICD-10-CM | POA: Diagnosis not present

## 2021-12-21 DIAGNOSIS — R2689 Other abnormalities of gait and mobility: Secondary | ICD-10-CM | POA: Diagnosis not present

## 2021-12-21 DIAGNOSIS — M6281 Muscle weakness (generalized): Secondary | ICD-10-CM | POA: Diagnosis not present

## 2021-12-21 DIAGNOSIS — F32A Depression, unspecified: Secondary | ICD-10-CM | POA: Diagnosis not present

## 2021-12-21 DIAGNOSIS — F5101 Primary insomnia: Secondary | ICD-10-CM | POA: Diagnosis not present

## 2021-12-21 DIAGNOSIS — G309 Alzheimer's disease, unspecified: Secondary | ICD-10-CM | POA: Diagnosis not present

## 2021-12-22 DIAGNOSIS — R54 Age-related physical debility: Secondary | ICD-10-CM | POA: Diagnosis not present

## 2021-12-22 DIAGNOSIS — R1310 Dysphagia, unspecified: Secondary | ICD-10-CM | POA: Diagnosis not present

## 2021-12-22 DIAGNOSIS — R2689 Other abnormalities of gait and mobility: Secondary | ICD-10-CM | POA: Diagnosis not present

## 2021-12-22 DIAGNOSIS — M6281 Muscle weakness (generalized): Secondary | ICD-10-CM | POA: Diagnosis not present

## 2021-12-22 DIAGNOSIS — R41841 Cognitive communication deficit: Secondary | ICD-10-CM | POA: Diagnosis not present

## 2021-12-22 DIAGNOSIS — G309 Alzheimer's disease, unspecified: Secondary | ICD-10-CM | POA: Diagnosis not present

## 2021-12-25 DIAGNOSIS — R41841 Cognitive communication deficit: Secondary | ICD-10-CM | POA: Diagnosis not present

## 2021-12-25 DIAGNOSIS — R54 Age-related physical debility: Secondary | ICD-10-CM | POA: Diagnosis not present

## 2021-12-25 DIAGNOSIS — R2689 Other abnormalities of gait and mobility: Secondary | ICD-10-CM | POA: Diagnosis not present

## 2021-12-25 DIAGNOSIS — R1310 Dysphagia, unspecified: Secondary | ICD-10-CM | POA: Diagnosis not present

## 2021-12-25 DIAGNOSIS — M6281 Muscle weakness (generalized): Secondary | ICD-10-CM | POA: Diagnosis not present

## 2021-12-25 DIAGNOSIS — G309 Alzheimer's disease, unspecified: Secondary | ICD-10-CM | POA: Diagnosis not present

## 2021-12-26 DIAGNOSIS — R54 Age-related physical debility: Secondary | ICD-10-CM | POA: Diagnosis not present

## 2021-12-26 DIAGNOSIS — R2689 Other abnormalities of gait and mobility: Secondary | ICD-10-CM | POA: Diagnosis not present

## 2021-12-26 DIAGNOSIS — G309 Alzheimer's disease, unspecified: Secondary | ICD-10-CM | POA: Diagnosis not present

## 2021-12-26 DIAGNOSIS — R41841 Cognitive communication deficit: Secondary | ICD-10-CM | POA: Diagnosis not present

## 2021-12-26 DIAGNOSIS — M6281 Muscle weakness (generalized): Secondary | ICD-10-CM | POA: Diagnosis not present

## 2021-12-26 DIAGNOSIS — R1310 Dysphagia, unspecified: Secondary | ICD-10-CM | POA: Diagnosis not present

## 2021-12-27 DIAGNOSIS — R2689 Other abnormalities of gait and mobility: Secondary | ICD-10-CM | POA: Diagnosis not present

## 2021-12-27 DIAGNOSIS — G309 Alzheimer's disease, unspecified: Secondary | ICD-10-CM | POA: Diagnosis not present

## 2021-12-27 DIAGNOSIS — R41841 Cognitive communication deficit: Secondary | ICD-10-CM | POA: Diagnosis not present

## 2021-12-27 DIAGNOSIS — R1310 Dysphagia, unspecified: Secondary | ICD-10-CM | POA: Diagnosis not present

## 2021-12-27 DIAGNOSIS — M6281 Muscle weakness (generalized): Secondary | ICD-10-CM | POA: Diagnosis not present

## 2021-12-27 DIAGNOSIS — R54 Age-related physical debility: Secondary | ICD-10-CM | POA: Diagnosis not present

## 2021-12-28 DIAGNOSIS — R1310 Dysphagia, unspecified: Secondary | ICD-10-CM | POA: Diagnosis not present

## 2021-12-28 DIAGNOSIS — Z0000 Encounter for general adult medical examination without abnormal findings: Secondary | ICD-10-CM | POA: Diagnosis not present

## 2021-12-28 DIAGNOSIS — R2689 Other abnormalities of gait and mobility: Secondary | ICD-10-CM | POA: Diagnosis not present

## 2021-12-28 DIAGNOSIS — Z299 Encounter for prophylactic measures, unspecified: Secondary | ICD-10-CM | POA: Diagnosis not present

## 2021-12-28 DIAGNOSIS — M6281 Muscle weakness (generalized): Secondary | ICD-10-CM | POA: Diagnosis not present

## 2021-12-28 DIAGNOSIS — G309 Alzheimer's disease, unspecified: Secondary | ICD-10-CM | POA: Diagnosis not present

## 2021-12-28 DIAGNOSIS — I10 Essential (primary) hypertension: Secondary | ICD-10-CM | POA: Diagnosis not present

## 2021-12-28 DIAGNOSIS — E1165 Type 2 diabetes mellitus with hyperglycemia: Secondary | ICD-10-CM | POA: Diagnosis not present

## 2021-12-28 DIAGNOSIS — R54 Age-related physical debility: Secondary | ICD-10-CM | POA: Diagnosis not present

## 2021-12-28 DIAGNOSIS — R41841 Cognitive communication deficit: Secondary | ICD-10-CM | POA: Diagnosis not present

## 2021-12-28 DIAGNOSIS — F0390 Unspecified dementia without behavioral disturbance: Secondary | ICD-10-CM | POA: Diagnosis not present

## 2021-12-29 DIAGNOSIS — R2689 Other abnormalities of gait and mobility: Secondary | ICD-10-CM | POA: Diagnosis not present

## 2021-12-29 DIAGNOSIS — R41841 Cognitive communication deficit: Secondary | ICD-10-CM | POA: Diagnosis not present

## 2021-12-29 DIAGNOSIS — G309 Alzheimer's disease, unspecified: Secondary | ICD-10-CM | POA: Diagnosis not present

## 2021-12-29 DIAGNOSIS — R1310 Dysphagia, unspecified: Secondary | ICD-10-CM | POA: Diagnosis not present

## 2021-12-29 DIAGNOSIS — M6281 Muscle weakness (generalized): Secondary | ICD-10-CM | POA: Diagnosis not present

## 2021-12-29 DIAGNOSIS — R54 Age-related physical debility: Secondary | ICD-10-CM | POA: Diagnosis not present

## 2022-01-01 DIAGNOSIS — G309 Alzheimer's disease, unspecified: Secondary | ICD-10-CM | POA: Diagnosis not present

## 2022-01-01 DIAGNOSIS — R2689 Other abnormalities of gait and mobility: Secondary | ICD-10-CM | POA: Diagnosis not present

## 2022-01-01 DIAGNOSIS — R1310 Dysphagia, unspecified: Secondary | ICD-10-CM | POA: Diagnosis not present

## 2022-01-01 DIAGNOSIS — M6281 Muscle weakness (generalized): Secondary | ICD-10-CM | POA: Diagnosis not present

## 2022-01-01 DIAGNOSIS — R41841 Cognitive communication deficit: Secondary | ICD-10-CM | POA: Diagnosis not present

## 2022-01-01 DIAGNOSIS — R54 Age-related physical debility: Secondary | ICD-10-CM | POA: Diagnosis not present

## 2022-01-02 DIAGNOSIS — R532 Functional quadriplegia: Secondary | ICD-10-CM | POA: Diagnosis not present

## 2022-01-02 DIAGNOSIS — R54 Age-related physical debility: Secondary | ICD-10-CM | POA: Diagnosis not present

## 2022-01-02 DIAGNOSIS — R2689 Other abnormalities of gait and mobility: Secondary | ICD-10-CM | POA: Diagnosis not present

## 2022-01-02 DIAGNOSIS — R1310 Dysphagia, unspecified: Secondary | ICD-10-CM | POA: Diagnosis not present

## 2022-01-02 DIAGNOSIS — F0390 Unspecified dementia without behavioral disturbance: Secondary | ICD-10-CM | POA: Diagnosis not present

## 2022-01-02 DIAGNOSIS — Z299 Encounter for prophylactic measures, unspecified: Secondary | ICD-10-CM | POA: Diagnosis not present

## 2022-01-02 DIAGNOSIS — G309 Alzheimer's disease, unspecified: Secondary | ICD-10-CM | POA: Diagnosis not present

## 2022-01-02 DIAGNOSIS — E1165 Type 2 diabetes mellitus with hyperglycemia: Secondary | ICD-10-CM | POA: Diagnosis not present

## 2022-01-02 DIAGNOSIS — N184 Chronic kidney disease, stage 4 (severe): Secondary | ICD-10-CM | POA: Diagnosis not present

## 2022-01-02 DIAGNOSIS — M6281 Muscle weakness (generalized): Secondary | ICD-10-CM | POA: Diagnosis not present

## 2022-01-02 DIAGNOSIS — R41841 Cognitive communication deficit: Secondary | ICD-10-CM | POA: Diagnosis not present

## 2022-01-03 DIAGNOSIS — R41841 Cognitive communication deficit: Secondary | ICD-10-CM | POA: Diagnosis not present

## 2022-01-03 DIAGNOSIS — G309 Alzheimer's disease, unspecified: Secondary | ICD-10-CM | POA: Diagnosis not present

## 2022-01-03 DIAGNOSIS — R2689 Other abnormalities of gait and mobility: Secondary | ICD-10-CM | POA: Diagnosis not present

## 2022-01-03 DIAGNOSIS — M6281 Muscle weakness (generalized): Secondary | ICD-10-CM | POA: Diagnosis not present

## 2022-01-03 DIAGNOSIS — R54 Age-related physical debility: Secondary | ICD-10-CM | POA: Diagnosis not present

## 2022-01-03 DIAGNOSIS — R1310 Dysphagia, unspecified: Secondary | ICD-10-CM | POA: Diagnosis not present

## 2022-01-04 DIAGNOSIS — G309 Alzheimer's disease, unspecified: Secondary | ICD-10-CM | POA: Diagnosis not present

## 2022-01-04 DIAGNOSIS — R41841 Cognitive communication deficit: Secondary | ICD-10-CM | POA: Diagnosis not present

## 2022-01-04 DIAGNOSIS — R2689 Other abnormalities of gait and mobility: Secondary | ICD-10-CM | POA: Diagnosis not present

## 2022-01-04 DIAGNOSIS — M6281 Muscle weakness (generalized): Secondary | ICD-10-CM | POA: Diagnosis not present

## 2022-01-04 DIAGNOSIS — R1310 Dysphagia, unspecified: Secondary | ICD-10-CM | POA: Diagnosis not present

## 2022-01-04 DIAGNOSIS — R54 Age-related physical debility: Secondary | ICD-10-CM | POA: Diagnosis not present

## 2022-01-05 DIAGNOSIS — R2689 Other abnormalities of gait and mobility: Secondary | ICD-10-CM | POA: Diagnosis not present

## 2022-01-05 DIAGNOSIS — R41841 Cognitive communication deficit: Secondary | ICD-10-CM | POA: Diagnosis not present

## 2022-01-05 DIAGNOSIS — R54 Age-related physical debility: Secondary | ICD-10-CM | POA: Diagnosis not present

## 2022-01-05 DIAGNOSIS — M6281 Muscle weakness (generalized): Secondary | ICD-10-CM | POA: Diagnosis not present

## 2022-01-05 DIAGNOSIS — G309 Alzheimer's disease, unspecified: Secondary | ICD-10-CM | POA: Diagnosis not present

## 2022-01-05 DIAGNOSIS — R1310 Dysphagia, unspecified: Secondary | ICD-10-CM | POA: Diagnosis not present

## 2022-01-08 DIAGNOSIS — M6281 Muscle weakness (generalized): Secondary | ICD-10-CM | POA: Diagnosis not present

## 2022-01-08 DIAGNOSIS — G309 Alzheimer's disease, unspecified: Secondary | ICD-10-CM | POA: Diagnosis not present

## 2022-01-08 DIAGNOSIS — R41841 Cognitive communication deficit: Secondary | ICD-10-CM | POA: Diagnosis not present

## 2022-01-08 DIAGNOSIS — R54 Age-related physical debility: Secondary | ICD-10-CM | POA: Diagnosis not present

## 2022-01-08 DIAGNOSIS — R2689 Other abnormalities of gait and mobility: Secondary | ICD-10-CM | POA: Diagnosis not present

## 2022-01-08 DIAGNOSIS — R1310 Dysphagia, unspecified: Secondary | ICD-10-CM | POA: Diagnosis not present

## 2022-01-09 DIAGNOSIS — R2689 Other abnormalities of gait and mobility: Secondary | ICD-10-CM | POA: Diagnosis not present

## 2022-01-09 DIAGNOSIS — R54 Age-related physical debility: Secondary | ICD-10-CM | POA: Diagnosis not present

## 2022-01-09 DIAGNOSIS — M6281 Muscle weakness (generalized): Secondary | ICD-10-CM | POA: Diagnosis not present

## 2022-01-09 DIAGNOSIS — R1310 Dysphagia, unspecified: Secondary | ICD-10-CM | POA: Diagnosis not present

## 2022-01-09 DIAGNOSIS — N184 Chronic kidney disease, stage 4 (severe): Secondary | ICD-10-CM | POA: Diagnosis not present

## 2022-01-09 DIAGNOSIS — I10 Essential (primary) hypertension: Secondary | ICD-10-CM | POA: Diagnosis not present

## 2022-01-09 DIAGNOSIS — Z299 Encounter for prophylactic measures, unspecified: Secondary | ICD-10-CM | POA: Diagnosis not present

## 2022-01-09 DIAGNOSIS — R41841 Cognitive communication deficit: Secondary | ICD-10-CM | POA: Diagnosis not present

## 2022-01-09 DIAGNOSIS — G309 Alzheimer's disease, unspecified: Secondary | ICD-10-CM | POA: Diagnosis not present

## 2022-01-10 DIAGNOSIS — M6281 Muscle weakness (generalized): Secondary | ICD-10-CM | POA: Diagnosis not present

## 2022-01-10 DIAGNOSIS — R1310 Dysphagia, unspecified: Secondary | ICD-10-CM | POA: Diagnosis not present

## 2022-01-10 DIAGNOSIS — R54 Age-related physical debility: Secondary | ICD-10-CM | POA: Diagnosis not present

## 2022-01-10 DIAGNOSIS — G309 Alzheimer's disease, unspecified: Secondary | ICD-10-CM | POA: Diagnosis not present

## 2022-01-10 DIAGNOSIS — R41841 Cognitive communication deficit: Secondary | ICD-10-CM | POA: Diagnosis not present

## 2022-01-10 DIAGNOSIS — R2689 Other abnormalities of gait and mobility: Secondary | ICD-10-CM | POA: Diagnosis not present

## 2022-01-11 DIAGNOSIS — R41841 Cognitive communication deficit: Secondary | ICD-10-CM | POA: Diagnosis not present

## 2022-01-11 DIAGNOSIS — N184 Chronic kidney disease, stage 4 (severe): Secondary | ICD-10-CM | POA: Diagnosis not present

## 2022-01-11 DIAGNOSIS — I10 Essential (primary) hypertension: Secondary | ICD-10-CM | POA: Diagnosis not present

## 2022-01-11 DIAGNOSIS — Z299 Encounter for prophylactic measures, unspecified: Secondary | ICD-10-CM | POA: Diagnosis not present

## 2022-01-11 DIAGNOSIS — G309 Alzheimer's disease, unspecified: Secondary | ICD-10-CM | POA: Diagnosis not present

## 2022-01-11 DIAGNOSIS — Z6829 Body mass index (BMI) 29.0-29.9, adult: Secondary | ICD-10-CM | POA: Diagnosis not present

## 2022-01-11 DIAGNOSIS — R1310 Dysphagia, unspecified: Secondary | ICD-10-CM | POA: Diagnosis not present

## 2022-01-11 DIAGNOSIS — R2689 Other abnormalities of gait and mobility: Secondary | ICD-10-CM | POA: Diagnosis not present

## 2022-01-11 DIAGNOSIS — M6281 Muscle weakness (generalized): Secondary | ICD-10-CM | POA: Diagnosis not present

## 2022-01-11 DIAGNOSIS — Z789 Other specified health status: Secondary | ICD-10-CM | POA: Diagnosis not present

## 2022-01-11 DIAGNOSIS — R54 Age-related physical debility: Secondary | ICD-10-CM | POA: Diagnosis not present

## 2022-01-12 DIAGNOSIS — R54 Age-related physical debility: Secondary | ICD-10-CM | POA: Diagnosis not present

## 2022-01-12 DIAGNOSIS — R1310 Dysphagia, unspecified: Secondary | ICD-10-CM | POA: Diagnosis not present

## 2022-01-12 DIAGNOSIS — R2689 Other abnormalities of gait and mobility: Secondary | ICD-10-CM | POA: Diagnosis not present

## 2022-01-12 DIAGNOSIS — M6281 Muscle weakness (generalized): Secondary | ICD-10-CM | POA: Diagnosis not present

## 2022-01-12 DIAGNOSIS — R41841 Cognitive communication deficit: Secondary | ICD-10-CM | POA: Diagnosis not present

## 2022-01-12 DIAGNOSIS — G309 Alzheimer's disease, unspecified: Secondary | ICD-10-CM | POA: Diagnosis not present

## 2022-01-13 DIAGNOSIS — R54 Age-related physical debility: Secondary | ICD-10-CM | POA: Diagnosis not present

## 2022-01-13 DIAGNOSIS — G309 Alzheimer's disease, unspecified: Secondary | ICD-10-CM | POA: Diagnosis not present

## 2022-01-13 DIAGNOSIS — M6281 Muscle weakness (generalized): Secondary | ICD-10-CM | POA: Diagnosis not present

## 2022-01-13 DIAGNOSIS — R1310 Dysphagia, unspecified: Secondary | ICD-10-CM | POA: Diagnosis not present

## 2022-01-13 DIAGNOSIS — R41841 Cognitive communication deficit: Secondary | ICD-10-CM | POA: Diagnosis not present

## 2022-01-13 DIAGNOSIS — R2689 Other abnormalities of gait and mobility: Secondary | ICD-10-CM | POA: Diagnosis not present

## 2022-01-23 DIAGNOSIS — K2970 Gastritis, unspecified, without bleeding: Secondary | ICD-10-CM | POA: Diagnosis not present

## 2022-01-23 DIAGNOSIS — R532 Functional quadriplegia: Secondary | ICD-10-CM | POA: Diagnosis not present

## 2022-01-23 DIAGNOSIS — I10 Essential (primary) hypertension: Secondary | ICD-10-CM | POA: Diagnosis not present

## 2022-01-23 DIAGNOSIS — Z299 Encounter for prophylactic measures, unspecified: Secondary | ICD-10-CM | POA: Diagnosis not present

## 2022-02-06 DIAGNOSIS — Z299 Encounter for prophylactic measures, unspecified: Secondary | ICD-10-CM | POA: Diagnosis not present

## 2022-02-06 DIAGNOSIS — W19XXXA Unspecified fall, initial encounter: Secondary | ICD-10-CM | POA: Diagnosis not present

## 2022-02-06 DIAGNOSIS — R532 Functional quadriplegia: Secondary | ICD-10-CM | POA: Diagnosis not present

## 2022-02-06 DIAGNOSIS — I10 Essential (primary) hypertension: Secondary | ICD-10-CM | POA: Diagnosis not present

## 2022-02-18 DIAGNOSIS — F02C18 Dementia in other diseases classified elsewhere, severe, with other behavioral disturbance: Secondary | ICD-10-CM | POA: Diagnosis not present

## 2022-02-18 DIAGNOSIS — F32A Depression, unspecified: Secondary | ICD-10-CM | POA: Diagnosis not present

## 2022-02-18 DIAGNOSIS — F5101 Primary insomnia: Secondary | ICD-10-CM | POA: Diagnosis not present

## 2022-02-18 DIAGNOSIS — G308 Other Alzheimer's disease: Secondary | ICD-10-CM | POA: Diagnosis not present

## 2022-02-18 DIAGNOSIS — F413 Other mixed anxiety disorders: Secondary | ICD-10-CM | POA: Diagnosis not present

## 2022-03-01 DIAGNOSIS — R532 Functional quadriplegia: Secondary | ICD-10-CM | POA: Diagnosis not present

## 2022-03-01 DIAGNOSIS — F0390 Unspecified dementia without behavioral disturbance: Secondary | ICD-10-CM | POA: Diagnosis not present

## 2022-03-01 DIAGNOSIS — E1165 Type 2 diabetes mellitus with hyperglycemia: Secondary | ICD-10-CM | POA: Diagnosis not present

## 2022-03-01 DIAGNOSIS — I10 Essential (primary) hypertension: Secondary | ICD-10-CM | POA: Diagnosis not present

## 2022-03-01 DIAGNOSIS — Z299 Encounter for prophylactic measures, unspecified: Secondary | ICD-10-CM | POA: Diagnosis not present

## 2022-03-26 NOTE — Progress Notes (Deleted)
Referring Provider:*** Primary Care Physician:  Glenda Chroman, MD Primary Gastroenterologist:  Dr. Abbey Chatters  No chief complaint on file.   HPI:   Victoria Christian is a 87 y.o. female presenting today at the request of *** for dysphagia. We haven't seen patient since 2019 for evaluation of anemia.   Per review of prior hospital note (03/01/2018), patient had colonoscopy at [facility] on April 14 for rectal bleeding with no acute findings.  There is no report in the system.  EGD 03/01/2018 (for anemia and black stool on oral iron) with normal esophagus, 4 mm sessile polyp in the gastric cardia removed, gastritis biopsied, normal examined duodenum biopsied.  Givens capsule 03/20/2018: Diagnosis was obscure GI bleed, gastritis, mild enteritis.  Recommended continuing p.o. iron.  Her Hgb has been stable in the 9-10 range.   Today:    Past Medical History:  Diagnosis Date   Dementia    Diabetes mellitus without complication ([employer])    Hypertension     Past Surgical History:  Procedure Laterality Date   ABDOMINAL HYSTERECTOMY     BREAST EXCISIONAL BIOPSY Left    1975   ESOPHAGOGASTRODUODENOSCOPY N/A 03/01/2018   Procedure: ESOPHAGOGASTRODUODENOSCOPY (EGD);  Surgeon: Danie Binder, MD;  [facility];  Service: Endoscopy;  Laterality: N/A;   EYE SURGERY     GIVENS CAPSULE STUDY N/A 03/20/2018   Procedure: GIVENS CAPSULE STUDY;  Surgeon: Danie Binder, MD;  Location: [facility];  Service: Endoscopy;  Laterality: N/A;  7:30am   POLYPECTOMY  03/01/2018   Procedure: POLYPECTOMY;  Surgeon: Danie Binder, MD;  Location: [facility];  Service: Endoscopy;;    Current Outpatient Medications  Medication Sig Dispense Refill   acetaminophen (TYLENOL) 650 MG CR tablet Take 650 mg by mouth every 6 (six) hours as needed for pain.     calcium carbonate (TUMS - DOSED IN MG ELEMENTAL CALCIUM) 500 MG chewable tablet Chew 1 tablet by mouth 2 (two) times daily.     cetirizine (ZYRTEC) 10  MG tablet Take 10 mg by mouth every evening.     cholecalciferol (VITAMIN D) 1000 units tablet Take 1,000 Units by mouth daily.     Cyanocobalamin (VITAMIN B12) 1000 MCG TBCR Take 1 tablet by mouth daily.     feeding supplement, GLUCERNA SHAKE, (GLUCERNA SHAKE) LIQD Take 237 mLs by mouth 3 (three) times daily between meals. 90 Can 0   ferrous sulfate 325 (65 FE) MG tablet Take 1 tablet (325 mg total) by mouth 2 (two) times daily with a meal. 60 tablet 3   glimepiride (AMARYL) 2 MG tablet Take 2 mg by mouth every morning.     metFORMIN (GLUCOPHAGE) 500 MG tablet Take 500 mg by mouth daily.      metoprolol succinate (TOPROL-XL) 25 MG 24 hr tablet Take 25 mg by mouth daily.     Multiple Vitamin (MULTIVITAMIN WITH MINERALS) TABS tablet Take 1 tablet by mouth daily.     No current facility-administered medications for this visit.    Allergies as of 03/28/2022   (No Known Allergies)    Family History  Problem Relation Age of Onset   Congestive Heart Failure Mother    Dementia Father    Cancer Other     Social History   Socioeconomic History   Marital status: Married    Spouse name: Not on file   Number of children: Not on file   Years of education: Not on file   Highest  education level: Not on file  Occupational History   Not on file  Tobacco Use   Smoking status: Never   Smokeless tobacco: Never  Vaping Use   Vaping Use: Never used  Substance and Sexual Activity   Alcohol use: Never   Drug use: Never   Sexual activity: Not on file  Other Topics Concern   Not on file  Social History Narrative   Not on file   Social Determinants of Health   Financial Resource Strain: Not on file  Food Insecurity: Not on file  Transportation Needs: Not on file  Physical Activity: Not on file  Stress: Not on file  Social Connections: Not on file  Intimate Partner Violence: Not on file    Review of Systems: Gen: Denies any fever, chills, cold or flulike symptoms, presyncope,  syncope. CV: Denies chest pain, heart palpitations. Resp: Denies shortness of breath, cough. GI: See HPI GU : Denies urinary burning, urinary frequency, urinary hesitancy MS: Denies joint pain. Derm: Denies rash. Psych: Denies depression, anxiety. Heme: See HPI  Physical Exam: There were no vitals taken for this visit. General:   Alert and oriented. Pleasant and cooperative. Well-nourished and well-developed.  Head:  Normocephalic and atraumatic. Eyes:  Without icterus, sclera clear and conjunctiva pink.  Ears:  Normal auditory acuity. Lungs:  Clear to auscultation bilaterally. No wheezes, rales, or rhonchi. No distress.  Heart:  S1, S2 present without murmurs appreciated.  Abdomen:  +BS, soft, non-tender and non-distended. No HSM noted. No guarding or rebound. No masses appreciated.  Rectal:  Deferred  Msk:  Symmetrical without gross deformities. Normal posture. Extremities:  Without edema. Neurologic:  Alert and  oriented x4;  grossly normal neurologically. Skin:  Intact without significant lesions or rashes. Psych: Normal mood and affect.    Assessment:     Plan:  ***   Aliene Altes, PA-C [facility] 03/28/2022

## 2022-03-28 ENCOUNTER — Ambulatory Visit: Payer: Medicare Other | Admitting: Gastroenterology

## 2022-03-28 ENCOUNTER — Encounter: Payer: Self-pay | Admitting: Internal Medicine

## 2022-05-15 DIAGNOSIS — N184 Chronic kidney disease, stage 4 (severe): Secondary | ICD-10-CM | POA: Diagnosis not present

## 2022-05-15 DIAGNOSIS — I6381 Other cerebral infarction due to occlusion or stenosis of small artery: Secondary | ICD-10-CM | POA: Diagnosis not present

## 2022-05-15 DIAGNOSIS — I739 Peripheral vascular disease, unspecified: Secondary | ICD-10-CM | POA: Diagnosis not present

## 2022-05-15 DIAGNOSIS — F0390 Unspecified dementia without behavioral disturbance: Secondary | ICD-10-CM | POA: Diagnosis not present

## 2022-05-15 DIAGNOSIS — Z299 Encounter for prophylactic measures, unspecified: Secondary | ICD-10-CM | POA: Diagnosis not present

## 2022-05-16 DIAGNOSIS — F32A Depression, unspecified: Secondary | ICD-10-CM | POA: Diagnosis not present

## 2022-05-16 DIAGNOSIS — F413 Other mixed anxiety disorders: Secondary | ICD-10-CM | POA: Diagnosis not present

## 2022-05-16 DIAGNOSIS — F02C18 Dementia in other diseases classified elsewhere, severe, with other behavioral disturbance: Secondary | ICD-10-CM | POA: Diagnosis not present

## 2022-05-16 DIAGNOSIS — G308 Other Alzheimer's disease: Secondary | ICD-10-CM | POA: Diagnosis not present

## 2022-05-16 DIAGNOSIS — F5101 Primary insomnia: Secondary | ICD-10-CM | POA: Diagnosis not present

## 2022-05-25 DIAGNOSIS — M6281 Muscle weakness (generalized): Secondary | ICD-10-CM | POA: Diagnosis not present

## 2022-05-25 DIAGNOSIS — W06XXXD Fall from bed, subsequent encounter: Secondary | ICD-10-CM | POA: Diagnosis not present

## 2022-05-25 DIAGNOSIS — R2689 Other abnormalities of gait and mobility: Secondary | ICD-10-CM | POA: Diagnosis not present

## 2022-05-26 DIAGNOSIS — W06XXXD Fall from bed, subsequent encounter: Secondary | ICD-10-CM | POA: Diagnosis not present

## 2022-05-26 DIAGNOSIS — R2689 Other abnormalities of gait and mobility: Secondary | ICD-10-CM | POA: Diagnosis not present

## 2022-05-26 DIAGNOSIS — M6281 Muscle weakness (generalized): Secondary | ICD-10-CM | POA: Diagnosis not present

## 2022-05-29 DIAGNOSIS — M6281 Muscle weakness (generalized): Secondary | ICD-10-CM | POA: Diagnosis not present

## 2022-05-29 DIAGNOSIS — G309 Alzheimer's disease, unspecified: Secondary | ICD-10-CM | POA: Diagnosis not present

## 2022-05-29 DIAGNOSIS — R2689 Other abnormalities of gait and mobility: Secondary | ICD-10-CM | POA: Diagnosis not present

## 2022-05-29 DIAGNOSIS — R41841 Cognitive communication deficit: Secondary | ICD-10-CM | POA: Diagnosis not present

## 2022-05-29 DIAGNOSIS — W06XXXD Fall from bed, subsequent encounter: Secondary | ICD-10-CM | POA: Diagnosis not present

## 2022-05-30 DIAGNOSIS — G309 Alzheimer's disease, unspecified: Secondary | ICD-10-CM | POA: Diagnosis not present

## 2022-05-30 DIAGNOSIS — W06XXXD Fall from bed, subsequent encounter: Secondary | ICD-10-CM | POA: Diagnosis not present

## 2022-05-30 DIAGNOSIS — R41841 Cognitive communication deficit: Secondary | ICD-10-CM | POA: Diagnosis not present

## 2022-05-30 DIAGNOSIS — R2689 Other abnormalities of gait and mobility: Secondary | ICD-10-CM | POA: Diagnosis not present

## 2022-05-30 DIAGNOSIS — M6281 Muscle weakness (generalized): Secondary | ICD-10-CM | POA: Diagnosis not present

## 2022-05-31 DIAGNOSIS — W06XXXD Fall from bed, subsequent encounter: Secondary | ICD-10-CM | POA: Diagnosis not present

## 2022-05-31 DIAGNOSIS — M6281 Muscle weakness (generalized): Secondary | ICD-10-CM | POA: Diagnosis not present

## 2022-05-31 DIAGNOSIS — R2689 Other abnormalities of gait and mobility: Secondary | ICD-10-CM | POA: Diagnosis not present

## 2022-05-31 DIAGNOSIS — R41841 Cognitive communication deficit: Secondary | ICD-10-CM | POA: Diagnosis not present

## 2022-05-31 DIAGNOSIS — G309 Alzheimer's disease, unspecified: Secondary | ICD-10-CM | POA: Diagnosis not present

## 2022-06-01 DIAGNOSIS — R2689 Other abnormalities of gait and mobility: Secondary | ICD-10-CM | POA: Diagnosis not present

## 2022-06-01 DIAGNOSIS — W06XXXD Fall from bed, subsequent encounter: Secondary | ICD-10-CM | POA: Diagnosis not present

## 2022-06-01 DIAGNOSIS — M6281 Muscle weakness (generalized): Secondary | ICD-10-CM | POA: Diagnosis not present

## 2022-06-01 DIAGNOSIS — G309 Alzheimer's disease, unspecified: Secondary | ICD-10-CM | POA: Diagnosis not present

## 2022-06-01 DIAGNOSIS — R41841 Cognitive communication deficit: Secondary | ICD-10-CM | POA: Diagnosis not present

## 2022-06-02 DIAGNOSIS — W06XXXD Fall from bed, subsequent encounter: Secondary | ICD-10-CM | POA: Diagnosis not present

## 2022-06-02 DIAGNOSIS — M6281 Muscle weakness (generalized): Secondary | ICD-10-CM | POA: Diagnosis not present

## 2022-06-02 DIAGNOSIS — R2689 Other abnormalities of gait and mobility: Secondary | ICD-10-CM | POA: Diagnosis not present

## 2022-06-02 DIAGNOSIS — R41841 Cognitive communication deficit: Secondary | ICD-10-CM | POA: Diagnosis not present

## 2022-06-02 DIAGNOSIS — G309 Alzheimer's disease, unspecified: Secondary | ICD-10-CM | POA: Diagnosis not present

## 2022-06-04 DIAGNOSIS — R41841 Cognitive communication deficit: Secondary | ICD-10-CM | POA: Diagnosis not present

## 2022-06-04 DIAGNOSIS — W06XXXD Fall from bed, subsequent encounter: Secondary | ICD-10-CM | POA: Diagnosis not present

## 2022-06-04 DIAGNOSIS — M6281 Muscle weakness (generalized): Secondary | ICD-10-CM | POA: Diagnosis not present

## 2022-06-04 DIAGNOSIS — G309 Alzheimer's disease, unspecified: Secondary | ICD-10-CM | POA: Diagnosis not present

## 2022-06-04 DIAGNOSIS — R2689 Other abnormalities of gait and mobility: Secondary | ICD-10-CM | POA: Diagnosis not present

## 2022-06-05 DIAGNOSIS — W06XXXD Fall from bed, subsequent encounter: Secondary | ICD-10-CM | POA: Diagnosis not present

## 2022-06-05 DIAGNOSIS — M6281 Muscle weakness (generalized): Secondary | ICD-10-CM | POA: Diagnosis not present

## 2022-06-05 DIAGNOSIS — I739 Peripheral vascular disease, unspecified: Secondary | ICD-10-CM | POA: Diagnosis not present

## 2022-06-05 DIAGNOSIS — R532 Functional quadriplegia: Secondary | ICD-10-CM | POA: Diagnosis not present

## 2022-06-05 DIAGNOSIS — R41841 Cognitive communication deficit: Secondary | ICD-10-CM | POA: Diagnosis not present

## 2022-06-05 DIAGNOSIS — G309 Alzheimer's disease, unspecified: Secondary | ICD-10-CM | POA: Diagnosis not present

## 2022-06-05 DIAGNOSIS — Z299 Encounter for prophylactic measures, unspecified: Secondary | ICD-10-CM | POA: Diagnosis not present

## 2022-06-05 DIAGNOSIS — R2689 Other abnormalities of gait and mobility: Secondary | ICD-10-CM | POA: Diagnosis not present

## 2022-06-05 DIAGNOSIS — F0390 Unspecified dementia without behavioral disturbance: Secondary | ICD-10-CM | POA: Diagnosis not present

## 2022-06-06 DIAGNOSIS — W06XXXD Fall from bed, subsequent encounter: Secondary | ICD-10-CM | POA: Diagnosis not present

## 2022-06-06 DIAGNOSIS — R41841 Cognitive communication deficit: Secondary | ICD-10-CM | POA: Diagnosis not present

## 2022-06-06 DIAGNOSIS — R2689 Other abnormalities of gait and mobility: Secondary | ICD-10-CM | POA: Diagnosis not present

## 2022-06-06 DIAGNOSIS — G309 Alzheimer's disease, unspecified: Secondary | ICD-10-CM | POA: Diagnosis not present

## 2022-06-06 DIAGNOSIS — M6281 Muscle weakness (generalized): Secondary | ICD-10-CM | POA: Diagnosis not present

## 2022-06-07 DIAGNOSIS — G309 Alzheimer's disease, unspecified: Secondary | ICD-10-CM | POA: Diagnosis not present

## 2022-06-07 DIAGNOSIS — M6281 Muscle weakness (generalized): Secondary | ICD-10-CM | POA: Diagnosis not present

## 2022-06-07 DIAGNOSIS — R2689 Other abnormalities of gait and mobility: Secondary | ICD-10-CM | POA: Diagnosis not present

## 2022-06-07 DIAGNOSIS — W06XXXD Fall from bed, subsequent encounter: Secondary | ICD-10-CM | POA: Diagnosis not present

## 2022-06-07 DIAGNOSIS — R41841 Cognitive communication deficit: Secondary | ICD-10-CM | POA: Diagnosis not present

## 2022-06-08 DIAGNOSIS — M6281 Muscle weakness (generalized): Secondary | ICD-10-CM | POA: Diagnosis not present

## 2022-06-08 DIAGNOSIS — W06XXXD Fall from bed, subsequent encounter: Secondary | ICD-10-CM | POA: Diagnosis not present

## 2022-06-08 DIAGNOSIS — G309 Alzheimer's disease, unspecified: Secondary | ICD-10-CM | POA: Diagnosis not present

## 2022-06-08 DIAGNOSIS — R2689 Other abnormalities of gait and mobility: Secondary | ICD-10-CM | POA: Diagnosis not present

## 2022-06-08 DIAGNOSIS — R41841 Cognitive communication deficit: Secondary | ICD-10-CM | POA: Diagnosis not present

## 2022-06-11 DIAGNOSIS — W06XXXD Fall from bed, subsequent encounter: Secondary | ICD-10-CM | POA: Diagnosis not present

## 2022-06-11 DIAGNOSIS — G309 Alzheimer's disease, unspecified: Secondary | ICD-10-CM | POA: Diagnosis not present

## 2022-06-11 DIAGNOSIS — M6281 Muscle weakness (generalized): Secondary | ICD-10-CM | POA: Diagnosis not present

## 2022-06-11 DIAGNOSIS — R2689 Other abnormalities of gait and mobility: Secondary | ICD-10-CM | POA: Diagnosis not present

## 2022-06-11 DIAGNOSIS — R41841 Cognitive communication deficit: Secondary | ICD-10-CM | POA: Diagnosis not present

## 2022-06-12 DIAGNOSIS — R2689 Other abnormalities of gait and mobility: Secondary | ICD-10-CM | POA: Diagnosis not present

## 2022-06-12 DIAGNOSIS — G309 Alzheimer's disease, unspecified: Secondary | ICD-10-CM | POA: Diagnosis not present

## 2022-06-12 DIAGNOSIS — W06XXXD Fall from bed, subsequent encounter: Secondary | ICD-10-CM | POA: Diagnosis not present

## 2022-06-12 DIAGNOSIS — R41841 Cognitive communication deficit: Secondary | ICD-10-CM | POA: Diagnosis not present

## 2022-06-12 DIAGNOSIS — M6281 Muscle weakness (generalized): Secondary | ICD-10-CM | POA: Diagnosis not present

## 2022-06-13 DIAGNOSIS — R41841 Cognitive communication deficit: Secondary | ICD-10-CM | POA: Diagnosis not present

## 2022-06-13 DIAGNOSIS — G309 Alzheimer's disease, unspecified: Secondary | ICD-10-CM | POA: Diagnosis not present

## 2022-06-13 DIAGNOSIS — M6281 Muscle weakness (generalized): Secondary | ICD-10-CM | POA: Diagnosis not present

## 2022-06-13 DIAGNOSIS — W06XXXD Fall from bed, subsequent encounter: Secondary | ICD-10-CM | POA: Diagnosis not present

## 2022-06-13 DIAGNOSIS — R2689 Other abnormalities of gait and mobility: Secondary | ICD-10-CM | POA: Diagnosis not present

## 2022-06-14 DIAGNOSIS — M6281 Muscle weakness (generalized): Secondary | ICD-10-CM | POA: Diagnosis not present

## 2022-06-14 DIAGNOSIS — R2689 Other abnormalities of gait and mobility: Secondary | ICD-10-CM | POA: Diagnosis not present

## 2022-06-14 DIAGNOSIS — G309 Alzheimer's disease, unspecified: Secondary | ICD-10-CM | POA: Diagnosis not present

## 2022-06-14 DIAGNOSIS — W06XXXD Fall from bed, subsequent encounter: Secondary | ICD-10-CM | POA: Diagnosis not present

## 2022-06-14 DIAGNOSIS — R41841 Cognitive communication deficit: Secondary | ICD-10-CM | POA: Diagnosis not present

## 2022-06-15 DIAGNOSIS — R41841 Cognitive communication deficit: Secondary | ICD-10-CM | POA: Diagnosis not present

## 2022-06-15 DIAGNOSIS — W06XXXD Fall from bed, subsequent encounter: Secondary | ICD-10-CM | POA: Diagnosis not present

## 2022-06-15 DIAGNOSIS — G309 Alzheimer's disease, unspecified: Secondary | ICD-10-CM | POA: Diagnosis not present

## 2022-06-15 DIAGNOSIS — M6281 Muscle weakness (generalized): Secondary | ICD-10-CM | POA: Diagnosis not present

## 2022-06-15 DIAGNOSIS — R2689 Other abnormalities of gait and mobility: Secondary | ICD-10-CM | POA: Diagnosis not present

## 2022-06-16 DIAGNOSIS — R2689 Other abnormalities of gait and mobility: Secondary | ICD-10-CM | POA: Diagnosis not present

## 2022-06-16 DIAGNOSIS — R41841 Cognitive communication deficit: Secondary | ICD-10-CM | POA: Diagnosis not present

## 2022-06-16 DIAGNOSIS — M6281 Muscle weakness (generalized): Secondary | ICD-10-CM | POA: Diagnosis not present

## 2022-06-16 DIAGNOSIS — G309 Alzheimer's disease, unspecified: Secondary | ICD-10-CM | POA: Diagnosis not present

## 2022-06-16 DIAGNOSIS — W06XXXD Fall from bed, subsequent encounter: Secondary | ICD-10-CM | POA: Diagnosis not present

## 2022-06-18 DIAGNOSIS — G309 Alzheimer's disease, unspecified: Secondary | ICD-10-CM | POA: Diagnosis not present

## 2022-06-18 DIAGNOSIS — R2689 Other abnormalities of gait and mobility: Secondary | ICD-10-CM | POA: Diagnosis not present

## 2022-06-18 DIAGNOSIS — W06XXXD Fall from bed, subsequent encounter: Secondary | ICD-10-CM | POA: Diagnosis not present

## 2022-06-18 DIAGNOSIS — R41841 Cognitive communication deficit: Secondary | ICD-10-CM | POA: Diagnosis not present

## 2022-06-18 DIAGNOSIS — M6281 Muscle weakness (generalized): Secondary | ICD-10-CM | POA: Diagnosis not present

## 2022-06-19 DIAGNOSIS — G309 Alzheimer's disease, unspecified: Secondary | ICD-10-CM | POA: Diagnosis not present

## 2022-06-19 DIAGNOSIS — M6281 Muscle weakness (generalized): Secondary | ICD-10-CM | POA: Diagnosis not present

## 2022-06-19 DIAGNOSIS — R2689 Other abnormalities of gait and mobility: Secondary | ICD-10-CM | POA: Diagnosis not present

## 2022-06-19 DIAGNOSIS — W06XXXD Fall from bed, subsequent encounter: Secondary | ICD-10-CM | POA: Diagnosis not present

## 2022-06-19 DIAGNOSIS — R41841 Cognitive communication deficit: Secondary | ICD-10-CM | POA: Diagnosis not present

## 2022-06-20 DIAGNOSIS — W06XXXD Fall from bed, subsequent encounter: Secondary | ICD-10-CM | POA: Diagnosis not present

## 2022-06-20 DIAGNOSIS — R41841 Cognitive communication deficit: Secondary | ICD-10-CM | POA: Diagnosis not present

## 2022-06-20 DIAGNOSIS — R2689 Other abnormalities of gait and mobility: Secondary | ICD-10-CM | POA: Diagnosis not present

## 2022-06-20 DIAGNOSIS — M6281 Muscle weakness (generalized): Secondary | ICD-10-CM | POA: Diagnosis not present

## 2022-06-20 DIAGNOSIS — G309 Alzheimer's disease, unspecified: Secondary | ICD-10-CM | POA: Diagnosis not present

## 2022-06-21 DIAGNOSIS — W06XXXD Fall from bed, subsequent encounter: Secondary | ICD-10-CM | POA: Diagnosis not present

## 2022-06-21 DIAGNOSIS — G309 Alzheimer's disease, unspecified: Secondary | ICD-10-CM | POA: Diagnosis not present

## 2022-06-21 DIAGNOSIS — R2689 Other abnormalities of gait and mobility: Secondary | ICD-10-CM | POA: Diagnosis not present

## 2022-06-21 DIAGNOSIS — M6281 Muscle weakness (generalized): Secondary | ICD-10-CM | POA: Diagnosis not present

## 2022-06-21 DIAGNOSIS — R41841 Cognitive communication deficit: Secondary | ICD-10-CM | POA: Diagnosis not present

## 2022-06-22 DIAGNOSIS — W06XXXD Fall from bed, subsequent encounter: Secondary | ICD-10-CM | POA: Diagnosis not present

## 2022-06-22 DIAGNOSIS — M6281 Muscle weakness (generalized): Secondary | ICD-10-CM | POA: Diagnosis not present

## 2022-06-22 DIAGNOSIS — R2689 Other abnormalities of gait and mobility: Secondary | ICD-10-CM | POA: Diagnosis not present

## 2022-06-22 DIAGNOSIS — R41841 Cognitive communication deficit: Secondary | ICD-10-CM | POA: Diagnosis not present

## 2022-06-22 DIAGNOSIS — G309 Alzheimer's disease, unspecified: Secondary | ICD-10-CM | POA: Diagnosis not present

## 2022-06-25 DIAGNOSIS — G309 Alzheimer's disease, unspecified: Secondary | ICD-10-CM | POA: Diagnosis not present

## 2022-06-25 DIAGNOSIS — R41841 Cognitive communication deficit: Secondary | ICD-10-CM | POA: Diagnosis not present

## 2022-06-25 DIAGNOSIS — R2689 Other abnormalities of gait and mobility: Secondary | ICD-10-CM | POA: Diagnosis not present

## 2022-06-25 DIAGNOSIS — M6281 Muscle weakness (generalized): Secondary | ICD-10-CM | POA: Diagnosis not present

## 2022-06-25 DIAGNOSIS — W06XXXD Fall from bed, subsequent encounter: Secondary | ICD-10-CM | POA: Diagnosis not present

## 2022-06-26 DIAGNOSIS — R2689 Other abnormalities of gait and mobility: Secondary | ICD-10-CM | POA: Diagnosis not present

## 2022-06-26 DIAGNOSIS — M6281 Muscle weakness (generalized): Secondary | ICD-10-CM | POA: Diagnosis not present

## 2022-06-26 DIAGNOSIS — G309 Alzheimer's disease, unspecified: Secondary | ICD-10-CM | POA: Diagnosis not present

## 2022-06-26 DIAGNOSIS — R41841 Cognitive communication deficit: Secondary | ICD-10-CM | POA: Diagnosis not present

## 2022-06-26 DIAGNOSIS — W06XXXD Fall from bed, subsequent encounter: Secondary | ICD-10-CM | POA: Diagnosis not present

## 2022-06-27 DIAGNOSIS — W06XXXD Fall from bed, subsequent encounter: Secondary | ICD-10-CM | POA: Diagnosis not present

## 2022-06-27 DIAGNOSIS — R41841 Cognitive communication deficit: Secondary | ICD-10-CM | POA: Diagnosis not present

## 2022-06-27 DIAGNOSIS — G309 Alzheimer's disease, unspecified: Secondary | ICD-10-CM | POA: Diagnosis not present

## 2022-06-27 DIAGNOSIS — R2689 Other abnormalities of gait and mobility: Secondary | ICD-10-CM | POA: Diagnosis not present

## 2022-06-27 DIAGNOSIS — M6281 Muscle weakness (generalized): Secondary | ICD-10-CM | POA: Diagnosis not present

## 2022-06-28 DIAGNOSIS — G309 Alzheimer's disease, unspecified: Secondary | ICD-10-CM | POA: Diagnosis not present

## 2022-06-28 DIAGNOSIS — R41841 Cognitive communication deficit: Secondary | ICD-10-CM | POA: Diagnosis not present

## 2022-06-28 DIAGNOSIS — R2689 Other abnormalities of gait and mobility: Secondary | ICD-10-CM | POA: Diagnosis not present

## 2022-06-28 DIAGNOSIS — W06XXXD Fall from bed, subsequent encounter: Secondary | ICD-10-CM | POA: Diagnosis not present

## 2022-06-28 DIAGNOSIS — M6281 Muscle weakness (generalized): Secondary | ICD-10-CM | POA: Diagnosis not present

## 2022-06-29 DIAGNOSIS — R41841 Cognitive communication deficit: Secondary | ICD-10-CM | POA: Diagnosis not present

## 2022-06-29 DIAGNOSIS — M6281 Muscle weakness (generalized): Secondary | ICD-10-CM | POA: Diagnosis not present

## 2022-06-29 DIAGNOSIS — W06XXXD Fall from bed, subsequent encounter: Secondary | ICD-10-CM | POA: Diagnosis not present

## 2022-06-29 DIAGNOSIS — Z23 Encounter for immunization: Secondary | ICD-10-CM | POA: Diagnosis not present

## 2022-06-29 DIAGNOSIS — G309 Alzheimer's disease, unspecified: Secondary | ICD-10-CM | POA: Diagnosis not present

## 2022-06-29 DIAGNOSIS — R2689 Other abnormalities of gait and mobility: Secondary | ICD-10-CM | POA: Diagnosis not present

## 2022-07-02 DIAGNOSIS — R41841 Cognitive communication deficit: Secondary | ICD-10-CM | POA: Diagnosis not present

## 2022-07-02 DIAGNOSIS — R2689 Other abnormalities of gait and mobility: Secondary | ICD-10-CM | POA: Diagnosis not present

## 2022-07-02 DIAGNOSIS — Z23 Encounter for immunization: Secondary | ICD-10-CM | POA: Diagnosis not present

## 2022-07-02 DIAGNOSIS — M6281 Muscle weakness (generalized): Secondary | ICD-10-CM | POA: Diagnosis not present

## 2022-07-02 DIAGNOSIS — W06XXXD Fall from bed, subsequent encounter: Secondary | ICD-10-CM | POA: Diagnosis not present

## 2022-07-02 DIAGNOSIS — G309 Alzheimer's disease, unspecified: Secondary | ICD-10-CM | POA: Diagnosis not present

## 2022-07-03 DIAGNOSIS — R41841 Cognitive communication deficit: Secondary | ICD-10-CM | POA: Diagnosis not present

## 2022-07-03 DIAGNOSIS — M6281 Muscle weakness (generalized): Secondary | ICD-10-CM | POA: Diagnosis not present

## 2022-07-03 DIAGNOSIS — R2689 Other abnormalities of gait and mobility: Secondary | ICD-10-CM | POA: Diagnosis not present

## 2022-07-03 DIAGNOSIS — G309 Alzheimer's disease, unspecified: Secondary | ICD-10-CM | POA: Diagnosis not present

## 2022-07-03 DIAGNOSIS — Z23 Encounter for immunization: Secondary | ICD-10-CM | POA: Diagnosis not present

## 2022-07-03 DIAGNOSIS — W06XXXD Fall from bed, subsequent encounter: Secondary | ICD-10-CM | POA: Diagnosis not present

## 2022-07-04 DIAGNOSIS — R2689 Other abnormalities of gait and mobility: Secondary | ICD-10-CM | POA: Diagnosis not present

## 2022-07-04 DIAGNOSIS — W06XXXD Fall from bed, subsequent encounter: Secondary | ICD-10-CM | POA: Diagnosis not present

## 2022-07-04 DIAGNOSIS — M6281 Muscle weakness (generalized): Secondary | ICD-10-CM | POA: Diagnosis not present

## 2022-07-04 DIAGNOSIS — G309 Alzheimer's disease, unspecified: Secondary | ICD-10-CM | POA: Diagnosis not present

## 2022-07-04 DIAGNOSIS — Z23 Encounter for immunization: Secondary | ICD-10-CM | POA: Diagnosis not present

## 2022-07-04 DIAGNOSIS — R41841 Cognitive communication deficit: Secondary | ICD-10-CM | POA: Diagnosis not present

## 2022-07-05 DIAGNOSIS — G309 Alzheimer's disease, unspecified: Secondary | ICD-10-CM | POA: Diagnosis not present

## 2022-07-05 DIAGNOSIS — M6281 Muscle weakness (generalized): Secondary | ICD-10-CM | POA: Diagnosis not present

## 2022-07-05 DIAGNOSIS — R2689 Other abnormalities of gait and mobility: Secondary | ICD-10-CM | POA: Diagnosis not present

## 2022-07-05 DIAGNOSIS — R41841 Cognitive communication deficit: Secondary | ICD-10-CM | POA: Diagnosis not present

## 2022-07-05 DIAGNOSIS — W06XXXD Fall from bed, subsequent encounter: Secondary | ICD-10-CM | POA: Diagnosis not present

## 2022-07-05 DIAGNOSIS — Z23 Encounter for immunization: Secondary | ICD-10-CM | POA: Diagnosis not present

## 2022-07-06 DIAGNOSIS — R41841 Cognitive communication deficit: Secondary | ICD-10-CM | POA: Diagnosis not present

## 2022-07-06 DIAGNOSIS — Z23 Encounter for immunization: Secondary | ICD-10-CM | POA: Diagnosis not present

## 2022-07-06 DIAGNOSIS — M6281 Muscle weakness (generalized): Secondary | ICD-10-CM | POA: Diagnosis not present

## 2022-07-06 DIAGNOSIS — R2689 Other abnormalities of gait and mobility: Secondary | ICD-10-CM | POA: Diagnosis not present

## 2022-07-06 DIAGNOSIS — W06XXXD Fall from bed, subsequent encounter: Secondary | ICD-10-CM | POA: Diagnosis not present

## 2022-07-06 DIAGNOSIS — G309 Alzheimer's disease, unspecified: Secondary | ICD-10-CM | POA: Diagnosis not present

## 2022-07-09 DIAGNOSIS — Z23 Encounter for immunization: Secondary | ICD-10-CM | POA: Diagnosis not present

## 2022-07-09 DIAGNOSIS — R41841 Cognitive communication deficit: Secondary | ICD-10-CM | POA: Diagnosis not present

## 2022-07-09 DIAGNOSIS — W06XXXD Fall from bed, subsequent encounter: Secondary | ICD-10-CM | POA: Diagnosis not present

## 2022-07-09 DIAGNOSIS — M6281 Muscle weakness (generalized): Secondary | ICD-10-CM | POA: Diagnosis not present

## 2022-07-09 DIAGNOSIS — G309 Alzheimer's disease, unspecified: Secondary | ICD-10-CM | POA: Diagnosis not present

## 2022-07-09 DIAGNOSIS — R2689 Other abnormalities of gait and mobility: Secondary | ICD-10-CM | POA: Diagnosis not present

## 2022-07-10 ENCOUNTER — Telehealth: Payer: Self-pay | Admitting: *Deleted

## 2022-07-10 DIAGNOSIS — Z23 Encounter for immunization: Secondary | ICD-10-CM | POA: Diagnosis not present

## 2022-07-10 DIAGNOSIS — W06XXXD Fall from bed, subsequent encounter: Secondary | ICD-10-CM | POA: Diagnosis not present

## 2022-07-10 DIAGNOSIS — M6281 Muscle weakness (generalized): Secondary | ICD-10-CM | POA: Diagnosis not present

## 2022-07-10 DIAGNOSIS — R2689 Other abnormalities of gait and mobility: Secondary | ICD-10-CM | POA: Diagnosis not present

## 2022-07-10 DIAGNOSIS — I10 Essential (primary) hypertension: Secondary | ICD-10-CM | POA: Diagnosis not present

## 2022-07-10 DIAGNOSIS — K2970 Gastritis, unspecified, without bleeding: Secondary | ICD-10-CM | POA: Diagnosis not present

## 2022-07-10 DIAGNOSIS — Z299 Encounter for prophylactic measures, unspecified: Secondary | ICD-10-CM | POA: Diagnosis not present

## 2022-07-10 DIAGNOSIS — R41841 Cognitive communication deficit: Secondary | ICD-10-CM | POA: Diagnosis not present

## 2022-07-10 DIAGNOSIS — G309 Alzheimer's disease, unspecified: Secondary | ICD-10-CM | POA: Diagnosis not present

## 2022-07-10 NOTE — Chronic Care Management (AMB) (Signed)
  Care Coordination  Outreach Note  07/10/2022 Name: Aaralyn Kil MRN: [id number] DOB: [birth year]   Care Coordination Outreach Attempts: An unsuccessful telephone outreach was attempted today to offer the patient information about available care coordination services as a benefit of their health plan.   Follow Up Plan:  Additional outreach attempts will be made to offer the patient care coordination information and services.   Encounter Outcome:  No Answer   [place]  Direct Dial: [phone]

## 2022-07-11 DIAGNOSIS — Z23 Encounter for immunization: Secondary | ICD-10-CM | POA: Diagnosis not present

## 2022-07-11 DIAGNOSIS — R41841 Cognitive communication deficit: Secondary | ICD-10-CM | POA: Diagnosis not present

## 2022-07-11 DIAGNOSIS — G309 Alzheimer's disease, unspecified: Secondary | ICD-10-CM | POA: Diagnosis not present

## 2022-07-11 DIAGNOSIS — M6281 Muscle weakness (generalized): Secondary | ICD-10-CM | POA: Diagnosis not present

## 2022-07-11 DIAGNOSIS — R2689 Other abnormalities of gait and mobility: Secondary | ICD-10-CM | POA: Diagnosis not present

## 2022-07-11 DIAGNOSIS — W06XXXD Fall from bed, subsequent encounter: Secondary | ICD-10-CM | POA: Diagnosis not present

## 2022-07-12 DIAGNOSIS — Z23 Encounter for immunization: Secondary | ICD-10-CM | POA: Diagnosis not present

## 2022-07-12 DIAGNOSIS — M6281 Muscle weakness (generalized): Secondary | ICD-10-CM | POA: Diagnosis not present

## 2022-07-12 DIAGNOSIS — R2689 Other abnormalities of gait and mobility: Secondary | ICD-10-CM | POA: Diagnosis not present

## 2022-07-12 DIAGNOSIS — W06XXXD Fall from bed, subsequent encounter: Secondary | ICD-10-CM | POA: Diagnosis not present

## 2022-07-12 DIAGNOSIS — R41841 Cognitive communication deficit: Secondary | ICD-10-CM | POA: Diagnosis not present

## 2022-07-12 DIAGNOSIS — G309 Alzheimer's disease, unspecified: Secondary | ICD-10-CM | POA: Diagnosis not present

## 2022-07-12 NOTE — Chronic Care Management (AMB) (Signed)
  Care Coordination   Note   07/12/2022 Name: Moyinoluwa Dawe MRN: [id number] DOB: 01/20/1935  Tanina Barb is a 87 y.o. year old female who sees Vyas, Dhruv B, MD for primary care. I reached out to [employer] by phone today to offer care coordination services.  Ms. Luevanos was given information about Care Coordination services today including:   The Care Coordination services include support from the care team which includes your Nurse Coordinator, Clinical Social Worker, or Pharmacist.  The Care Coordination team is here to help remove barriers to the health concerns and goals most important to you. Care Coordination services are voluntary, and the patient may decline or stop services at any time by request to their care team member.   Care Coordination Consent Status: Patient did not agree to participate in care coordination services at this time.    Encounter Outcome:  Pt. Refused due to being in a SNF.  [place]  Direct Dial: [phone]

## 2022-07-13 DIAGNOSIS — R41841 Cognitive communication deficit: Secondary | ICD-10-CM | POA: Diagnosis not present

## 2022-07-13 DIAGNOSIS — G309 Alzheimer's disease, unspecified: Secondary | ICD-10-CM | POA: Diagnosis not present

## 2022-07-13 DIAGNOSIS — R2689 Other abnormalities of gait and mobility: Secondary | ICD-10-CM | POA: Diagnosis not present

## 2022-07-13 DIAGNOSIS — M6281 Muscle weakness (generalized): Secondary | ICD-10-CM | POA: Diagnosis not present

## 2022-07-13 DIAGNOSIS — W06XXXD Fall from bed, subsequent encounter: Secondary | ICD-10-CM | POA: Diagnosis not present

## 2022-07-13 DIAGNOSIS — Z23 Encounter for immunization: Secondary | ICD-10-CM | POA: Diagnosis not present

## 2022-07-17 DIAGNOSIS — Z299 Encounter for prophylactic measures, unspecified: Secondary | ICD-10-CM | POA: Diagnosis not present

## 2022-07-17 DIAGNOSIS — R2689 Other abnormalities of gait and mobility: Secondary | ICD-10-CM | POA: Diagnosis not present

## 2022-07-17 DIAGNOSIS — M6281 Muscle weakness (generalized): Secondary | ICD-10-CM | POA: Diagnosis not present

## 2022-07-17 DIAGNOSIS — W06XXXD Fall from bed, subsequent encounter: Secondary | ICD-10-CM | POA: Diagnosis not present

## 2022-07-17 DIAGNOSIS — R238 Other skin changes: Secondary | ICD-10-CM | POA: Diagnosis not present

## 2022-07-17 DIAGNOSIS — L89150 Pressure ulcer of sacral region, unstageable: Secondary | ICD-10-CM | POA: Diagnosis not present

## 2022-07-17 DIAGNOSIS — R41841 Cognitive communication deficit: Secondary | ICD-10-CM | POA: Diagnosis not present

## 2022-07-17 DIAGNOSIS — Z23 Encounter for immunization: Secondary | ICD-10-CM | POA: Diagnosis not present

## 2022-07-17 DIAGNOSIS — G309 Alzheimer's disease, unspecified: Secondary | ICD-10-CM | POA: Diagnosis not present

## 2022-07-20 DIAGNOSIS — M2041 Other hammer toe(s) (acquired), right foot: Secondary | ICD-10-CM | POA: Diagnosis not present

## 2022-07-20 DIAGNOSIS — Z7984 Long term (current) use of oral hypoglycemic drugs: Secondary | ICD-10-CM | POA: Diagnosis not present

## 2022-07-20 DIAGNOSIS — B351 Tinea unguium: Secondary | ICD-10-CM | POA: Diagnosis not present

## 2022-07-20 DIAGNOSIS — E1151 Type 2 diabetes mellitus with diabetic peripheral angiopathy without gangrene: Secondary | ICD-10-CM | POA: Diagnosis not present

## 2022-07-20 DIAGNOSIS — M2042 Other hammer toe(s) (acquired), left foot: Secondary | ICD-10-CM | POA: Diagnosis not present

## 2022-07-24 DIAGNOSIS — M6281 Muscle weakness (generalized): Secondary | ICD-10-CM | POA: Diagnosis not present

## 2022-07-24 DIAGNOSIS — F419 Anxiety disorder, unspecified: Secondary | ICD-10-CM | POA: Diagnosis not present

## 2022-07-24 DIAGNOSIS — M1990 Unspecified osteoarthritis, unspecified site: Secondary | ICD-10-CM | POA: Diagnosis not present

## 2022-07-24 DIAGNOSIS — F29 Unspecified psychosis not due to a substance or known physiological condition: Secondary | ICD-10-CM | POA: Diagnosis not present

## 2022-07-24 DIAGNOSIS — M810 Age-related osteoporosis without current pathological fracture: Secondary | ICD-10-CM | POA: Diagnosis not present

## 2022-07-24 DIAGNOSIS — F32A Depression, unspecified: Secondary | ICD-10-CM | POA: Diagnosis not present

## 2022-07-24 DIAGNOSIS — Z8616 Personal history of COVID-19: Secondary | ICD-10-CM | POA: Diagnosis not present

## 2022-07-24 DIAGNOSIS — F02811 Dementia in other diseases classified elsewhere, unspecified severity, with agitation: Secondary | ICD-10-CM | POA: Diagnosis not present

## 2022-07-24 DIAGNOSIS — E1165 Type 2 diabetes mellitus with hyperglycemia: Secondary | ICD-10-CM | POA: Diagnosis not present

## 2022-07-24 DIAGNOSIS — E785 Hyperlipidemia, unspecified: Secondary | ICD-10-CM | POA: Diagnosis not present

## 2022-07-24 DIAGNOSIS — I10 Essential (primary) hypertension: Secondary | ICD-10-CM | POA: Diagnosis not present

## 2022-07-24 DIAGNOSIS — I700 Atherosclerosis of aorta: Secondary | ICD-10-CM | POA: Diagnosis not present

## 2022-07-24 DIAGNOSIS — G309 Alzheimer's disease, unspecified: Secondary | ICD-10-CM | POA: Diagnosis not present

## 2022-07-25 DIAGNOSIS — G309 Alzheimer's disease, unspecified: Secondary | ICD-10-CM | POA: Diagnosis not present

## 2022-07-25 DIAGNOSIS — M6281 Muscle weakness (generalized): Secondary | ICD-10-CM | POA: Diagnosis not present

## 2022-07-25 DIAGNOSIS — I10 Essential (primary) hypertension: Secondary | ICD-10-CM | POA: Diagnosis not present

## 2022-07-25 DIAGNOSIS — F02811 Dementia in other diseases classified elsewhere, unspecified severity, with agitation: Secondary | ICD-10-CM | POA: Diagnosis not present

## 2022-07-25 DIAGNOSIS — E1165 Type 2 diabetes mellitus with hyperglycemia: Secondary | ICD-10-CM | POA: Diagnosis not present

## 2022-07-25 DIAGNOSIS — F29 Unspecified psychosis not due to a substance or known physiological condition: Secondary | ICD-10-CM | POA: Diagnosis not present

## 2022-07-26 DIAGNOSIS — G309 Alzheimer's disease, unspecified: Secondary | ICD-10-CM | POA: Diagnosis not present

## 2022-07-26 DIAGNOSIS — I10 Essential (primary) hypertension: Secondary | ICD-10-CM | POA: Diagnosis not present

## 2022-07-26 DIAGNOSIS — E1165 Type 2 diabetes mellitus with hyperglycemia: Secondary | ICD-10-CM | POA: Diagnosis not present

## 2022-07-26 DIAGNOSIS — F29 Unspecified psychosis not due to a substance or known physiological condition: Secondary | ICD-10-CM | POA: Diagnosis not present

## 2022-07-26 DIAGNOSIS — F02811 Dementia in other diseases classified elsewhere, unspecified severity, with agitation: Secondary | ICD-10-CM | POA: Diagnosis not present

## 2022-07-26 DIAGNOSIS — M6281 Muscle weakness (generalized): Secondary | ICD-10-CM | POA: Diagnosis not present

## 2022-07-27 DIAGNOSIS — F29 Unspecified psychosis not due to a substance or known physiological condition: Secondary | ICD-10-CM | POA: Diagnosis not present

## 2022-07-27 DIAGNOSIS — G309 Alzheimer's disease, unspecified: Secondary | ICD-10-CM | POA: Diagnosis not present

## 2022-07-27 DIAGNOSIS — M6281 Muscle weakness (generalized): Secondary | ICD-10-CM | POA: Diagnosis not present

## 2022-07-27 DIAGNOSIS — I10 Essential (primary) hypertension: Secondary | ICD-10-CM | POA: Diagnosis not present

## 2022-07-27 DIAGNOSIS — E1165 Type 2 diabetes mellitus with hyperglycemia: Secondary | ICD-10-CM | POA: Diagnosis not present

## 2022-07-27 DIAGNOSIS — F02811 Dementia in other diseases classified elsewhere, unspecified severity, with agitation: Secondary | ICD-10-CM | POA: Diagnosis not present

## 2022-07-28 DIAGNOSIS — I10 Essential (primary) hypertension: Secondary | ICD-10-CM | POA: Diagnosis not present

## 2022-07-28 DIAGNOSIS — M6281 Muscle weakness (generalized): Secondary | ICD-10-CM | POA: Diagnosis not present

## 2022-07-28 DIAGNOSIS — G309 Alzheimer's disease, unspecified: Secondary | ICD-10-CM | POA: Diagnosis not present

## 2022-07-28 DIAGNOSIS — F02811 Dementia in other diseases classified elsewhere, unspecified severity, with agitation: Secondary | ICD-10-CM | POA: Diagnosis not present

## 2022-07-28 DIAGNOSIS — E1165 Type 2 diabetes mellitus with hyperglycemia: Secondary | ICD-10-CM | POA: Diagnosis not present

## 2022-07-28 DIAGNOSIS — F29 Unspecified psychosis not due to a substance or known physiological condition: Secondary | ICD-10-CM | POA: Diagnosis not present

## 2022-07-29 DEATH — deceased
